# Patient Record
Sex: Female | Born: 1965 | Race: White | Hispanic: No | Marital: Married | Smoking: Former smoker
Health system: Southern US, Community
[De-identification: ages and names within clinical notes are randomized; demographics above are authoritative.]

## PROBLEM LIST (undated history)

## (undated) DIAGNOSIS — R3915 Urgency of urination: Secondary | ICD-10-CM

## (undated) DIAGNOSIS — K589 Irritable bowel syndrome without diarrhea: Secondary | ICD-10-CM

## (undated) DIAGNOSIS — R519 Headache, unspecified: Secondary | ICD-10-CM

## (undated) DIAGNOSIS — I10 Essential (primary) hypertension: Secondary | ICD-10-CM

## (undated) DIAGNOSIS — F32A Depression, unspecified: Secondary | ICD-10-CM

## (undated) DIAGNOSIS — Z87828 Personal history of other (healed) physical injury and trauma: Secondary | ICD-10-CM

## (undated) DIAGNOSIS — N368 Other specified disorders of urethra: Secondary | ICD-10-CM

## (undated) DIAGNOSIS — M1990 Unspecified osteoarthritis, unspecified site: Secondary | ICD-10-CM

## (undated) DIAGNOSIS — R350 Frequency of micturition: Secondary | ICD-10-CM

## (undated) DIAGNOSIS — K219 Gastro-esophageal reflux disease without esophagitis: Secondary | ICD-10-CM

## (undated) DIAGNOSIS — N189 Chronic kidney disease, unspecified: Secondary | ICD-10-CM

## (undated) DIAGNOSIS — Z8489 Family history of other specified conditions: Secondary | ICD-10-CM

## (undated) DIAGNOSIS — Z973 Presence of spectacles and contact lenses: Secondary | ICD-10-CM

## (undated) DIAGNOSIS — R51 Headache: Secondary | ICD-10-CM

## (undated) HISTORY — PX: OTHER SURGICAL HISTORY: SHX169

## (undated) HISTORY — PX: CERVICAL SPINE SURGERY: SHX589

## (undated) HISTORY — PX: OVARIAN CYST REMOVAL: SHX89

## (undated) HISTORY — PX: TRANSOBTURATOR SLING: SHX2571

## (undated) HISTORY — PX: TUBAL LIGATION: SHX77

---

## 1988-12-13 HISTORY — PX: MICROTUBOPLASTY: SHX5401

## 2003-04-15 HISTORY — PX: CHOLECYSTECTOMY: SHX55

## 2005-04-14 HISTORY — PX: VAGINAL HYSTERECTOMY: SUR661

## 2014-03-24 ENCOUNTER — Other Ambulatory Visit: Payer: Self-pay | Admitting: Urology

## 2014-04-26 ENCOUNTER — Encounter (HOSPITAL_BASED_OUTPATIENT_CLINIC_OR_DEPARTMENT_OTHER): Payer: Self-pay | Admitting: *Deleted

## 2014-04-27 ENCOUNTER — Encounter (HOSPITAL_BASED_OUTPATIENT_CLINIC_OR_DEPARTMENT_OTHER): Payer: Self-pay | Admitting: *Deleted

## 2014-04-28 ENCOUNTER — Encounter (HOSPITAL_BASED_OUTPATIENT_CLINIC_OR_DEPARTMENT_OTHER): Payer: Self-pay | Admitting: *Deleted

## 2014-04-28 NOTE — Progress Notes (Signed)
NPO AFTER MN. ARRIVE AT 0715. NEEDS ISTAT AND EKG.  WILL DO HIBICLENS HS BEFORE AND AM DOS. WILL TAKE DEXILANT AM DOS W/ SIPS OF WATER.  DEPENDING ON WHAT TYPE OF BP MED. SHE IS ON , WILL NEED TO TAKE PRE-OP. PT TO BRING COMPLETE LIST OF MEDS, AND MEDS, DOS. SHE DID NOT KNOW DOSAGE'S , DID NOT KNOW NAME OF BP MED, AND STATES SHE DID NOT HAVE MEDICATION BOTTLES AT HOME.  REVIEWED [facility] GUIDELINES.

## 2014-05-01 ENCOUNTER — Ambulatory Visit (HOSPITAL_BASED_OUTPATIENT_CLINIC_OR_DEPARTMENT_OTHER): Payer: Medicare Other | Admitting: Certified Registered"

## 2014-05-01 ENCOUNTER — Ambulatory Visit (HOSPITAL_BASED_OUTPATIENT_CLINIC_OR_DEPARTMENT_OTHER)
Admission: RE | Admit: 2014-05-01 | Discharge: 2014-05-02 | Disposition: A | Discharge status: Home / Self Care | Payer: Medicare Other | Source: Ambulatory Visit | Attending: Urology | Admitting: Urology

## 2014-05-01 ENCOUNTER — Other Ambulatory Visit: Payer: Self-pay

## 2014-05-01 ENCOUNTER — Encounter (HOSPITAL_BASED_OUTPATIENT_CLINIC_OR_DEPARTMENT_OTHER): Payer: Self-pay | Admitting: Certified Registered"

## 2014-05-01 ENCOUNTER — Encounter (HOSPITAL_BASED_OUTPATIENT_CLINIC_OR_DEPARTMENT_OTHER)
Admission: RE | Disposition: A | Discharge status: Home / Self Care | Payer: Self-pay | Source: Ambulatory Visit | Attending: Urology

## 2014-05-01 DIAGNOSIS — Y929 Unspecified place or not applicable: Secondary | ICD-10-CM | POA: Insufficient documentation

## 2014-05-01 DIAGNOSIS — I10 Essential (primary) hypertension: Secondary | ICD-10-CM | POA: Diagnosis not present

## 2014-05-01 DIAGNOSIS — K219 Gastro-esophageal reflux disease without esophagitis: Secondary | ICD-10-CM | POA: Diagnosis not present

## 2014-05-01 DIAGNOSIS — Z79899 Other long term (current) drug therapy: Secondary | ICD-10-CM | POA: Diagnosis not present

## 2014-05-01 DIAGNOSIS — N393 Stress incontinence (female) (male): Secondary | ICD-10-CM | POA: Diagnosis not present

## 2014-05-01 DIAGNOSIS — G43909 Migraine, unspecified, not intractable, without status migrainosus: Secondary | ICD-10-CM | POA: Insufficient documentation

## 2014-05-01 DIAGNOSIS — G8929 Other chronic pain: Secondary | ICD-10-CM | POA: Insufficient documentation

## 2014-05-01 DIAGNOSIS — Z7982 Long term (current) use of aspirin: Secondary | ICD-10-CM | POA: Diagnosis not present

## 2014-05-01 DIAGNOSIS — N368 Other specified disorders of urethra: Secondary | ICD-10-CM | POA: Diagnosis not present

## 2014-05-01 DIAGNOSIS — F329 Major depressive disorder, single episode, unspecified: Secondary | ICD-10-CM | POA: Insufficient documentation

## 2014-05-01 DIAGNOSIS — N058 Unspecified nephritic syndrome with other morphologic changes: Secondary | ICD-10-CM | POA: Insufficient documentation

## 2014-05-01 DIAGNOSIS — Z87891 Personal history of nicotine dependence: Secondary | ICD-10-CM | POA: Diagnosis not present

## 2014-05-01 DIAGNOSIS — K589 Irritable bowel syndrome without diarrhea: Secondary | ICD-10-CM | POA: Diagnosis not present

## 2014-05-01 DIAGNOSIS — T83711A Erosion of implanted vaginal mesh and other prosthetic materials to surrounding organ or tissue, initial encounter: Secondary | ICD-10-CM | POA: Insufficient documentation

## 2014-05-01 DIAGNOSIS — Y69 Unspecified misadventure during surgical and medical care: Secondary | ICD-10-CM | POA: Diagnosis not present

## 2014-05-01 DIAGNOSIS — M1990 Unspecified osteoarthritis, unspecified site: Secondary | ICD-10-CM | POA: Diagnosis not present

## 2014-05-01 DIAGNOSIS — F909 Attention-deficit hyperactivity disorder, unspecified type: Secondary | ICD-10-CM | POA: Insufficient documentation

## 2014-05-01 DIAGNOSIS — R32 Unspecified urinary incontinence: Secondary | ICD-10-CM | POA: Diagnosis present

## 2014-05-01 HISTORY — DX: Essential (primary) hypertension: I10

## 2014-05-01 HISTORY — DX: Urgency of urination: R39.15

## 2014-05-01 HISTORY — DX: Frequency of micturition: R35.0

## 2014-05-01 HISTORY — DX: Headache, unspecified: R51.9

## 2014-05-01 HISTORY — DX: Headache: R51

## 2014-05-01 HISTORY — DX: Personal history of other (healed) physical injury and trauma: Z87.828

## 2014-05-01 HISTORY — DX: Gastro-esophageal reflux disease without esophagitis: K21.9

## 2014-05-01 HISTORY — DX: Presence of spectacles and contact lenses: Z97.3

## 2014-05-01 HISTORY — DX: Irritable bowel syndrome, unspecified: K58.9

## 2014-05-01 HISTORY — DX: Other specified disorders of urethra: N36.8

## 2014-05-01 HISTORY — PX: REVISION URINARY SLING: SHX6213

## 2014-05-01 LAB — POCT I-STAT 4, (NA,K, GLUC, HGB,HCT)
Glucose, Bld: 94 mg/dL (ref 70–99)
HEMATOCRIT: 38 % (ref 36.0–46.0)
Hemoglobin: 12.9 g/dL (ref 12.0–15.0)
Potassium: 3.5 mmol/L (ref 3.5–5.1)
SODIUM: 138 mmol/L (ref 135–145)

## 2014-05-01 SURGERY — REVISION, SLING OPERATION, FOR STRESS INCONTINENCE
Anesthesia: General | Site: Vagina

## 2014-05-01 MED ORDER — FENTANYL CITRATE 0.05 MG/ML IJ SOLN
INTRAMUSCULAR | Status: DC | PRN
  Administered 2014-05-01: 25 ug via INTRAVENOUS
  Administered 2014-05-01: 50 ug via INTRAVENOUS
  Administered 2014-05-01 (×3): 25 ug via INTRAVENOUS
  Administered 2014-05-01: 50 ug via INTRAVENOUS

## 2014-05-01 MED ORDER — CEFAZOLIN SODIUM-DEXTROSE 2-3 GM-% IV SOLR
INTRAVENOUS | Status: AC
  Filled 2014-05-01: qty 50

## 2014-05-01 MED ORDER — CIPROFLOXACIN HCL 500 MG PO TABS
500.0000 mg | ORAL_TABLET | Freq: Two times a day (BID) | ORAL | Status: DC
  Administered 2014-05-01: 500 mg via ORAL
  Filled 2014-05-01: qty 1

## 2014-05-01 MED ORDER — MIDAZOLAM HCL 5 MG/5ML IJ SOLN
INTRAMUSCULAR | Status: DC | PRN
  Administered 2014-05-01: 2 mg via INTRAVENOUS

## 2014-05-01 MED ORDER — DIPHENHYDRAMINE HCL 12.5 MG/5ML PO ELIX
12.5000 mg | ORAL_SOLUTION | Freq: Four times a day (QID) | ORAL | Status: DC | PRN
Start: 2014-05-01 — End: 2014-05-02
  Filled 2014-05-01: qty 10

## 2014-05-01 MED ORDER — SODIUM CHLORIDE 0.45 % IV SOLN
INTRAVENOUS | Status: DC
  Administered 2014-05-01: via INTRAVENOUS
  Filled 2014-05-01: qty 1000

## 2014-05-01 MED ORDER — STERILE WATER FOR IRRIGATION IR SOLN
Status: DC | PRN
  Administered 2014-05-01: 500 mL

## 2014-05-01 MED ORDER — SODIUM CHLORIDE 0.9 % IR SOLN
Status: DC | PRN
  Administered 2014-05-01: 500 mL

## 2014-05-01 MED ORDER — MIDAZOLAM HCL 2 MG/2ML IJ SOLN
INTRAMUSCULAR | Status: AC
  Filled 2014-05-01: qty 2

## 2014-05-01 MED ORDER — HYOSCYAMINE SULFATE 0.125 MG SL SUBL
0.1250 mg | SUBLINGUAL_TABLET | SUBLINGUAL | Status: DC | PRN
  Filled 2014-05-01: qty 1

## 2014-05-01 MED ORDER — HYDROCODONE-ACETAMINOPHEN 5-325 MG PO TABS
1.0000 | ORAL_TABLET | Freq: Four times a day (QID) | ORAL | Status: DC | PRN
  Administered 2014-05-01: 1 via ORAL
  Filled 2014-05-01: qty 1

## 2014-05-01 MED ORDER — DULOXETINE HCL 60 MG PO CPEP
60.0000 mg | ORAL_CAPSULE | Freq: Every day | ORAL | Status: DC
  Filled 2014-05-01: qty 1

## 2014-05-01 MED ORDER — SENNOSIDES-DOCUSATE SODIUM 8.6-50 MG PO TABS
2.0000 | ORAL_TABLET | Freq: Every day | ORAL | Status: DC
Start: 2014-05-01 — End: 2014-05-02
  Filled 2014-05-01: qty 2

## 2014-05-01 MED ORDER — LACTATED RINGERS IV SOLN
INTRAVENOUS | Status: DC
  Administered 2014-05-01 (×2): via INTRAVENOUS
  Filled 2014-05-01: qty 1000

## 2014-05-01 MED ORDER — LACTATED RINGERS IV SOLN
INTRAVENOUS | Status: DC
  Filled 2014-05-01: qty 1000

## 2014-05-01 MED ORDER — CYCLOBENZAPRINE HCL 10 MG PO TABS
10.0000 mg | ORAL_TABLET | Freq: Three times a day (TID) | ORAL | Status: DC | PRN
  Filled 2014-05-01: qty 1

## 2014-05-01 MED ORDER — ONDANSETRON HCL 4 MG/2ML IJ SOLN
INTRAMUSCULAR | Status: DC | PRN
  Administered 2014-05-01: 4 mg via INTRAVENOUS

## 2014-05-01 MED ORDER — CIPROFLOXACIN HCL 500 MG PO TABS
ORAL_TABLET | ORAL | Status: AC
  Filled 2014-05-01: qty 1

## 2014-05-01 MED ORDER — LIDOCAINE HCL (CARDIAC) 20 MG/ML IV SOLN
INTRAVENOUS | Status: DC | PRN
  Administered 2014-05-01: 60 mg via INTRAVENOUS

## 2014-05-01 MED ORDER — HYDROCODONE-ACETAMINOPHEN 5-325 MG PO TABS
ORAL_TABLET | ORAL | Status: AC
  Filled 2014-05-01: qty 1

## 2014-05-01 MED ORDER — ZOLPIDEM TARTRATE 5 MG PO TABS
ORAL_TABLET | ORAL | Status: AC
  Filled 2014-05-01: qty 1

## 2014-05-01 MED ORDER — ACETAMINOPHEN 10 MG/ML IV SOLN
1000.0000 mg | Freq: Once | INTRAVENOUS | Status: AC
  Filled 2014-05-01: qty 100

## 2014-05-01 MED ORDER — ACETAMINOPHEN 10 MG/ML IV SOLN
INTRAVENOUS | Status: DC | PRN
  Administered 2014-05-01: 1000 mg via INTRAVENOUS

## 2014-05-01 MED ORDER — FUROSEMIDE 40 MG PO TABS
40.0000 mg | ORAL_TABLET | Freq: Every day | ORAL | Status: DC
  Filled 2014-05-01: qty 1

## 2014-05-01 MED ORDER — AMPHETAMINE-DEXTROAMPHET ER 20 MG PO CP24
20.0000 mg | ORAL_CAPSULE | Freq: Every day | ORAL | Status: DC
  Filled 2014-05-01: qty 1

## 2014-05-01 MED ORDER — KETOROLAC TROMETHAMINE 30 MG/ML IJ SOLN
30.0000 mg | Freq: Once | INTRAMUSCULAR | Status: AC
  Administered 2014-05-01: 30 mg via INTRAVENOUS
  Filled 2014-05-01: qty 1

## 2014-05-01 MED ORDER — DIPHENHYDRAMINE HCL 50 MG/ML IJ SOLN
12.5000 mg | Freq: Four times a day (QID) | INTRAMUSCULAR | Status: DC | PRN
  Filled 2014-05-01: qty 0.5

## 2014-05-01 MED ORDER — CHLORHEXIDINE GLUCONATE 4 % EX LIQD
Freq: Once | CUTANEOUS | Status: DC
  Filled 2014-05-01: qty 15

## 2014-05-01 MED ORDER — PANTOPRAZOLE SODIUM 40 MG PO TBEC
40.0000 mg | DELAYED_RELEASE_TABLET | Freq: Every day | ORAL | Status: DC
  Filled 2014-05-01: qty 1

## 2014-05-01 MED ORDER — ZOLPIDEM TARTRATE 5 MG PO TABS
5.0000 mg | ORAL_TABLET | Freq: Every evening | ORAL | Status: DC | PRN
  Administered 2014-05-01: 5 mg via ORAL
  Filled 2014-05-01: qty 1

## 2014-05-01 MED ORDER — LOSARTAN POTASSIUM-HCTZ 50-12.5 MG PO TABS: 1.0000 | ORAL_TABLET | Freq: Every day | ORAL | Status: DC

## 2014-05-01 MED ORDER — ESTRADIOL 0.1 MG/GM VA CREA
TOPICAL_CREAM | VAGINAL | Status: DC | PRN
  Administered 2014-05-01: 1 via VAGINAL

## 2014-05-01 MED ORDER — ONDANSETRON HCL 4 MG/2ML IJ SOLN
4.0000 mg | INTRAMUSCULAR | Status: DC | PRN
  Filled 2014-05-01: qty 2

## 2014-05-01 MED ORDER — BUPIVACAINE-EPINEPHRINE 0.5% -1:200000 IJ SOLN
INTRAMUSCULAR | Status: DC | PRN
  Administered 2014-05-01: 10 mL

## 2014-05-01 MED ORDER — PROPOFOL 10 MG/ML IV BOLUS
INTRAVENOUS | Status: DC | PRN
  Administered 2014-05-01: 200 mg via INTRAVENOUS

## 2014-05-01 MED ORDER — FENTANYL CITRATE 0.05 MG/ML IJ SOLN
INTRAMUSCULAR | Status: AC
  Filled 2014-05-01: qty 6

## 2014-05-01 MED ORDER — DICYCLOMINE HCL 10 MG PO CAPS
10.0000 mg | ORAL_CAPSULE | ORAL | Status: DC | PRN
  Filled 2014-05-01: qty 1

## 2014-05-01 MED ORDER — ESTROGENS CONJUGATED 0.625 MG PO TABS
0.6250 mg | ORAL_TABLET | Freq: Every day | ORAL | Status: DC
  Filled 2014-05-01: qty 1

## 2014-05-01 MED ORDER — HYDROCODONE-ACETAMINOPHEN 5-325 MG PO TABS
1.0000 | ORAL_TABLET | Freq: Four times a day (QID) | ORAL | Status: DC | PRN
  Filled 2014-05-01: qty 1

## 2014-05-01 MED ORDER — DEXAMETHASONE SODIUM PHOSPHATE 4 MG/ML IJ SOLN
INTRAMUSCULAR | Status: DC | PRN
  Administered 2014-05-01: 8 mg via INTRAVENOUS

## 2014-05-01 MED ORDER — CEFAZOLIN SODIUM-DEXTROSE 2-3 GM-% IV SOLR
2.0000 g | INTRAVENOUS | Status: AC
  Administered 2014-05-01: 2 g via INTRAVENOUS
  Filled 2014-05-01: qty 50

## 2014-05-01 SURGICAL SUPPLY — 42 items
ALLOGRAFT TIS AXIS TUTPLST 4X7 (Mesh General) ×1 IMPLANT
BAG URINE DRAINAGE (UROLOGICAL SUPPLIES) ×2 IMPLANT
BLADE CLIPPER SURG (BLADE) ×2 IMPLANT
BLADE SURG 10 STRL SS (BLADE) ×2 IMPLANT
BLADE SURG 15 STRL LF DISP TIS (BLADE) ×1 IMPLANT
BLADE SURG 15 STRL SS (BLADE) ×1
CANISTER SUCTION 2500CC (MISCELLANEOUS) ×4 IMPLANT
CATH FOLEY 2WAY SLVR  5CC 16FR (CATHETERS) ×1
CATH FOLEY 2WAY SLVR 5CC 16FR (CATHETERS) ×1 IMPLANT
COVER MAYO STAND STRL (DRAPES) ×2 IMPLANT
COVER TABLE BACK 60X90 (DRAPES) ×2 IMPLANT
DEVICE CAPIO SLIM BOX (INSTRUMENTS) IMPLANT
DRAPE CAMERA CLOSED 9X96 (DRAPES) ×2 IMPLANT
DRAPE UNDERBUTTOCKS STRL (DRAPE) ×2 IMPLANT
FLOSEAL 10ML (HEMOSTASIS) ×2 IMPLANT
GLOVE BIO SURGEON STRL SZ 6.5 (GLOVE) ×2 IMPLANT
GLOVE BIO SURGEON STRL SZ7.5 (GLOVE) ×2 IMPLANT
GLOVE BIOGEL PI IND STRL 6.5 (GLOVE) ×2 IMPLANT
GLOVE BIOGEL PI INDICATOR 6.5 (GLOVE) ×2
GOWN STRL REUS W/ TWL XL LVL3 (GOWN DISPOSABLE) ×1 IMPLANT
GOWN STRL REUS W/TWL XL LVL3 (GOWN DISPOSABLE) ×3 IMPLANT
NEEDLE HYPO 22GX1.5 SAFETY (NEEDLE) ×4 IMPLANT
PACKING VAGINAL (PACKING) ×2 IMPLANT
PENCIL BUTTON HOLSTER BLD 10FT (ELECTRODE) ×2 IMPLANT
PLUG CATH AND CAP STER (CATHETERS) ×2 IMPLANT
RETRACTOR LONRSTAR 16.6X16.6CM (MISCELLANEOUS) ×1 IMPLANT
RETRACTOR STAY HOOK 5MM (MISCELLANEOUS) ×2 IMPLANT
RETRACTOR STER APS 16.6X16.6CM (MISCELLANEOUS) ×2
SET IRRIG Y TYPE TUR BLADDER L (SET/KITS/TRAYS/PACK) ×2 IMPLANT
SHEET LAVH (DRAPES) ×2 IMPLANT
SLING SOLYX SYSTEM SIS BX (SLING) IMPLANT
SPONGE LAP 4X18 X RAY DECT (DISPOSABLE) ×2 IMPLANT
SUCTION FRAZIER TIP 10 FR DISP (SUCTIONS) ×2 IMPLANT
SUT VIC AB 2-0 UR6 27 (SUTURE) ×8 IMPLANT
SYR BULB IRRIGATION 50ML (SYRINGE) ×2 IMPLANT
SYR CONTROL 10ML LL (SYRINGE) ×2 IMPLANT
SYRINGE 10CC LL (SYRINGE) ×2 IMPLANT
TISSUE AXIS TUTOPLAST 4X7 (Mesh General) ×2 IMPLANT
TRAY DSU PREP LF (CUSTOM PROCEDURE TRAY) ×2 IMPLANT
TUBE CONNECTING 12X1/4 (SUCTIONS) ×4 IMPLANT
WATER STERILE IRR 500ML POUR (IV SOLUTION) ×4 IMPLANT
YANKAUER SUCT BULB TIP NO VENT (SUCTIONS) ×2 IMPLANT

## 2014-05-01 NOTE — H&P (Signed)
Reason For Visit Cystoscopy & review urodynamic results   Active Problems Problems  1. Urinary incontinence without sensory awareness (N39.42)   Assessed By: Jethro Bolusannenbaum, Eulalah Rupert (Urology); Last Assessed: 14 Mar 2014 2. Vaginal Sling Operation For Stress Incontinence  History of Present Illness    49 year old female returns today for a cystoscopy & to review urodynamic results. She is s/p trans-obturator tape, referred for evaluation of voiding difficulty, with urinary straining, urgency, and postvoid dribbling. She is para 4-3-0. 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 The patient began to notice cough laugh sneeze incontinence in 2002. She also had abnormal uterine bleeding, and underwent coincidental LAVH, with trans-obturator sling in 2000. She did well until 2013, when she began to note urinary leakage, without sensation. The amount of leakage varies from a small amount, to enough to soak her close. There is no associated pelvic pain. She uses no tobacco.     She exercises consistently, and has been treated recently for recurrent vaginal odor, and vaginal infection. She has urinary urgency, frequency, and nocturia x 3. Of importance is her past history of ADHD, treated with Adderall, as well as chronic pain, treated with Cymbalta. She also takes Lasix 40 mg per day. She drinks 3 Cokes per day.(Note bladder effect of medications). Her past history is also significant for degenerative cervical spinal stenosis.    Video urodynamics on 02/14/14, shows a maximum cystometric capacity of 224 cc. She has a normal desire to void at 44 cc, and a strong desire at 186 cc. The first unstable bladder contraction occurs at 120 cc, with a maximum unstable bladder contraction pressure of 14 cm of water pressure, with no urinary leakage. She is able to inhibit these unstable contractions.    Valsalva leak point pressure: The patient did  not leak for Valsalva leak point pressure with a maximum abdominal pressure generation of 135cm of water pressure.    Pressure flow study: The patient has a voluntary bladder contraction, with voided volume of 198 cc, and maximum flow rate of 10 cc/s. The detrusor pressure at maximum flow was 43 cm of water pressure, with maximum detrusor pressure of 50-70 cm H20 pressure. Postvoid residual was 25 cc. The patient appears to have a high pressure with obstructed flow pattern.    ECGs accomplished, and shows no reflux.    The patient appears to have a maximum capacity of 224 cc. There is no stress incontinence noted. She does have bladder pressures as high as 135 cm of water. She has mild instability but is able to inhibit these unstable contractions. She has relatively low voiding flow rate, and appears to be obstructed. She does empty efficiently, however. It is noted that the patient mentioned that she would like to have her sling removed. She will be counseled accordingly.   Past Medical History Problems  1. History of Degenerative cervical spinal stenosis (M48.02) 2. History of attention deficit hyperactivity disorder (ADHD) (Z86.59) 3. History of degenerative disc disease (Z87.39) 4. History of depression (Z86.59) 5. History of esophageal reflux (Z87.19) 6. History of gastric ulcer (Z87.19) 7. History of hypertension (Z86.79) 8. History of irritable bowel syndrome (Z87.19) 9. History of migraine headaches (Z86.69) 10. History of osteoarthritis (Z87.39)  Surgical History Problems  1. History of Hysterectomy 2. History of Mid-Urethral Sling Operation 3. Vaginal Sling Operation For Stress Incontinence  Current Meds 1. Aspirin 81 MG Oral Tablet;  Therapy: (Recorded:07Oct2015) to Recorded 2. Cymbalta 60  MG Oral Capsule Delayed Release Particles;  Therapy: (Recorded:07Oct2015) to Recorded 3. Dexilant 60 MG Oral Capsule Delayed Release;  Therapy: (Recorded:07Oct2015) to  Recorded 4. Diltiazem HCl - 120 MG Oral Tablet;  Therapy: (Recorded:07Oct2015) to Recorded 5. Flexeril 10 MG TABS;  Therapy: (Recorded:07Oct2015) to Recorded 6. Hydrocodone-Acetaminophen 5-500 MG Oral Tablet;  Therapy: (Recorded:07Oct2015) to Recorded 7. Lasix 20 MG Oral Tablet;  Therapy: (Recorded:07Oct2015) to Recorded 8. Premarin 0.625 MG Oral Tablet;  Therapy: (Recorded:07Oct2015) to Recorded 9. Zantac 150 MG CAPS;  Therapy: (Recorded:07Oct2015) to Recorded  Allergies Medication  1. No Known Drug Allergies  Family History Problems  1. No pertinent family history : Mother, Father  Social History Problems  1. Alcohol use (F10.99)   occasionally 2. Caffeine use (F15.90)   2-3 3. Former smoker [phone])   1/2 ppd for 5 years, quite 1995 4. Three children   2 sons, 1 dtr  Review of Systems Genitourinary, constitutional, skin, eye, otolaryngeal, hematologic/lymphatic, cardiovascular, pulmonary, endocrine, musculoskeletal, gastrointestinal, neurological and psychiatric system(s) were reviewed and pertinent findings if present are noted and are otherwise negative.  Genitourinary: urinary frequency, urinary urgency, dysuria, nocturia, incontinence, urinary hesitancy, urinary stream starts and stops and incomplete emptying of bladder.  Gastrointestinal: heartburn and constipation.  Constitutional: feeling tired (fatigue).  Eyes: blurred vision.  ENT: sinus problems.  Hematologic/Lymphatic: a tendency to easily bruise.  Endocrine: polydipsia.  Musculoskeletal: back pain and joint pain.  Neurological: dizziness and headache.  Psychiatric: depression and anxiety.    Vitals Vital Signs [Data Includes: Last 1 Day]  Recorded: 01Dec2015 03:11PM  Blood Pressure: 126 / 85 Temperature: 98.5 F Heart Rate: 112  Physical Exam Constitutional: Well nourished and well developed . No acute distress.  ENT:. The ears and nose are normal in appearance.  Neck: The appearance of the  neck is normal and no neck mass is present.  Pulmonary: No respiratory distress and normal respiratory rhythm and effort.  Cardiovascular:. No peripheral edema.  Abdomen: The abdomen is mildly obese. The abdomen is soft and nontender. No masses are palpated. No CVA tenderness. No hernias are palpable. No hepatosplenomegaly noted.  Genitourinary:  Chaperone Present: laura.  Examination of the external genitalia shows normal female external genitalia, no vulvar atrophy and no lesions. The urethra is normal in appearance and not tender. There is no urethral mass. Vaginal exam demonstrates no abnormalities and no mass. No cystocele is identified. No enterocele is identified. No rectocele is identified. There is no evidence of a vesicovaginal fistula. The adnexa are palpably normal. The bladder is normal on palpation, non tender and not distended. The anus is normal on inspection. The perineum is normal on inspection.  Lymphatics: The femoral and inguinal nodes are not enlarged or tender.  Skin: Normal skin turgor, no visible rash and no visible skin lesions.  Neuro/Psych:. Mood and affect are appropriate.    Results/Data Urine [Data Includes: Last 1 Day]   01Dec2015  COLOR YELLOW   APPEARANCE CLEAR   SPECIFIC GRAVITY 1.025   pH 6.0   GLUCOSE NEG mg/dL  BILIRUBIN SMALL   KETONE NEG mg/dL  BLOOD TRACE   PROTEIN NEG mg/dL  UROBILINOGEN 1 mg/dL  NITRITE NEG   LEUKOCYTE ESTERASE NEG   SQUAMOUS EPITHELIAL/HPF MANY   WBC NONE SEEN WBC/hpf  RBC 0-2 RBC/hpf  BACTERIA MODERATE   CRYSTALS Calcium Oxalate crystals noted   CASTS NONE SEEN   Other MUCUS    Procedure  Procedure: Cystoscopy  Chaperone Present: laura.  Indication: Lower Urinary Tract Symptoms.  Informed Consent: Risks, benefits, and potential adverse events were discussed and informed consent was obtained from the patient.  Prep: The patient was prepped with betadine.  Anesthesia:. Local anesthesia was administered  intraurethrally with 2% lidocaine jelly.  Antibiotic prophylaxis: Ciprofloxacin.  Procedure Note:  Urethral meatus:. Was identified. Obstructing tissue overlying the meatus.  Anterior urethra: No abnormalities.  Prostatic urethra: No abnormalities.  Bladder: Visulization was clear. The ureteral orifices were in the normal anatomic position bilaterally. A systematic survey of the bladder demonstrated no bladder tumors or stones. The patient tolerated the procedure well.  Complications: None.    Assessment Assessed  1. Vaginal Sling Operation For Stress Incontinence 2. Urinary incontinence without sensory awareness (N39.42)  49 yo female, originally post TOT sling in [place], 5-6 yrs ago, co-incidental with vaginal hysterectomy. She is complaining of 4 yrs of post void urinary incontinence, and feeling of incomplete emptying. Her urodynamics show high pressure, and low flow voiding pattern. cystoscopy shows a "flap" of tissue overlying her urethra, causing "spray" of voided urine. I have advised her to have this tissue removed, and to have the sling incised with a portion removed. The sling may have migrated to the bladder neck in the posterior portion of the urethra, where it is more likely to cause obstruction.    With sling incision, she may experience stress urinary incontinence once again, and we have discussed the possibility or 2ndary sling, or possibly injection of anti-incontinence material.   Plan Health Maintenance  1. UA With REFLEX; [Do Not Release]; Status:Resulted - Requires  Verification;   Done: 01Dec2015 02:52PM  Schedule surgery of the obstructed urethra.   Signatures Electronically signed by : Jethro Bolus, M.D.; Mar 14 2014  4:16PM EST

## 2014-05-01 NOTE — Interval H&P Note (Signed)
History and Physical Interval Note:  05/01/2014 8:35 AM  Angela Arroyo  has presented today for surgery, with the diagnosis of OBSTRUCTED URETHRA  The various methods of treatment have been discussed with the patient and family. After consideration of risks, benefits and other options for treatment, the patient has consented to  Procedure(s) with comments: REVISION URINARY SLING (N/A) - VAGINAL EXPLORATION AND INCISION OF TOT SLING REMOVAL OF PERI-URETHRAL TISSUE   as a surgical intervention .  The patient's history has been reviewed, patient examined, no change in status, stable for surgery.  I have reviewed the patient's chart and labs.  Questions were answered to the patient's satisfaction.     Jethro BolusANNENBAUM, Kaesyn Johnston I

## 2014-05-01 NOTE — Anesthesia Preprocedure Evaluation (Signed)
Anesthesia Evaluation  Patient identified by MRN, date of birth, ID band Patient awake    Reviewed: Allergy & Precautions, H&P , NPO status , Patient's Chart, lab work & pertinent test results  Airway Mallampati: II  TM Distance: >3 FB Neck ROM: full    Dental no notable dental hx. (+) Teeth Intact, Dental Advisory Given   Pulmonary neg pulmonary ROS, former smoker,  breath sounds clear to auscultation  Pulmonary exam normal       Cardiovascular Exercise Tolerance: Good hypertension, Pt. on medications negative cardio ROS  Rhythm:regular Rate:Normal     Neuro/Psych negative neurological ROS  negative psych ROS   GI/Hepatic negative GI ROS, Neg liver ROS, GERD-  Medicated and Controlled,  Endo/Other  negative endocrine ROS  Renal/GU negative Renal ROS  negative genitourinary   Musculoskeletal   Abdominal   Peds  Hematology negative hematology ROS (+)   Anesthesia Other Findings   Reproductive/Obstetrics negative OB ROS                             Anesthesia Physical Anesthesia Plan  ASA: II  Anesthesia Plan: General   Post-op Pain Management:    Induction: Intravenous  Airway Management Planned: LMA  Additional Equipment:   Intra-op Plan:   Post-operative Plan:   Informed Consent: I have reviewed the patients History and Physical, chart, labs and discussed the procedure including the risks, benefits and alternatives for the proposed anesthesia with the patient or authorized representative who has indicated his/her understanding and acceptance.   Dental Advisory Given  Plan Discussed with: CRNA and Surgeon  Anesthesia Plan Comments:         Anesthesia Quick Evaluation

## 2014-05-01 NOTE — Anesthesia Procedure Notes (Signed)
Procedure Name: LMA Insertion Date/Time: 05/01/2014 9:15 AM Performed by: Renella CunasHAZEL, Alex Leahy D Pre-anesthesia Checklist: Patient identified, Emergency Drugs available, Suction available and Patient being monitored Patient Re-evaluated:Patient Re-evaluated prior to inductionOxygen Delivery Method: Circle System Utilized Preoxygenation: Pre-oxygenation with 100% oxygen Intubation Type: IV induction Ventilation: Mask ventilation without difficulty LMA: LMA inserted LMA Size: 4.0 Number of attempts: 1 Airway Equipment and Method: Bite block Placement Confirmation: positive ETCO2 Tube secured with: Tape Dental Injury: Teeth and Oropharynx as per pre-operative assessment

## 2014-05-01 NOTE — Anesthesia Postprocedure Evaluation (Signed)
  Anesthesia Post-op Note  Patient: Angela Arroyo  Procedure(s) Performed: Procedure(s) (LRB): URETHERAL EXPLORATION, URETHEROLYSIS, EXCISION OF TRANSOBTORATOR MESH SLING WITH EXCISION OF VAGINAL SINUS, IMPLANTATION OF AXIS DERMIS 4X4 CM, AUGMENTATION RECONSTRUCTION OF INCISION SITE, PLACEMENT OF FLOSEAL (N/A)  Patient Location: PACU  Anesthesia Type: General  Level of Consciousness: awake and alert   Airway and Oxygen Therapy: Patient Spontanous Breathing  Post-op Pain: mild  Post-op Assessment: Post-op Vital signs reviewed, Patient's Cardiovascular Status Stable, Respiratory Function Stable, Patent Airway and No signs of Nausea or vomiting  Last Vitals:  Filed Vitals:   05/01/14 1145  BP: 97/63  Pulse: 89  Temp:   Resp: 13    Post-op Vital Signs: stable   Complications: No apparent anesthesia complications

## 2014-05-01 NOTE — Op Note (Signed)
Pre-operative diagnosis :  Vaginal mesh extrusion,with vaginal discomfort  Postoperative diagnosis: Same  Operation: Excision of vaginal mesh extrusion, with excision of vaginal sinus, and implantation of AXIS Dermis  ( 4cm x 4cm) with augmentation bladder neck repair.   Surgeon:  Kathie RhodesS. Patsi Searsannenbaum, MD  First assistant:  none  Anesthesia:  General LMA  Preparation:  After appropriate preanesthesia, the patient was brought the operating, placed on the operating table in the dorsal supine position where general LMA anesthesia was introduced. She was then replaced in the dorsal lithotomy position with the pubis was prepped with Betadine solution and draped in usual fashion. Arm was double checked. The history was double checked. The entire history was reviewed with the patient and her husband prior to the this surgical procedure.  Review history:   49 year old female returns today for a cystoscopy & to review urodynamic results. She is s/p trans-obturator tape, referred for evaluation of voiding difficulty, with urinary straining, urgency, and postvoid dribbling. She is para 4-3-0. 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 The patient began to notice cough laugh sneeze incontinence in 2002. She also had abnormal uterine bleeding, and underwent coincidental LAVH, with trans-obturator sling in 2000. She did well until 2013, when she began to note urinary leakage, without sensation. The amount of leakage varies from a small amount, to enough to soak her close. There is no associated pelvic pain. She uses no tobacco.     She exercises consistently, and has been treated recently for recurrent vaginal odor, and vaginal infection. She has urinary urgency, frequency, and nocturia x 3. Of importance is her past history of ADHD, treated with Adderall, as well as chronic pain, treated with Cymbalta. She also takes Lasix 40 mg per day. She drinks 3 Cokes  per day.(Note bladder effect of medications). Her past history is also significant for degenerative cervical spinal stenosis.    Video urodynamics on 02/14/14, shows a maximum cystometric capacity of 224 cc. She has a normal desire to void at 44 cc, and a strong desire at 186 cc. The first unstable bladder contraction occurs at 120 cc, with a maximum unstable bladder contraction pressure of 14 cm of water pressure, with no urinary leakage. She is able to inhibit these unstable contractions.    Valsalva leak point pressure: The patient did not leak for Valsalva leak point pressure with a maximum abdominal pressure generation of 135cm of water pressure.    Pressure flow study: The patient has a voluntary bladder contraction, with voided volume of 198 cc, and maximum flow rate of 10 cc/s. The detrusor pressure at maximum flow was 43 cm of water pressure, with maximum detrusor pressure of 50-70 cm H20 pressure. Postvoid residual was 25 cc. The patient appears to have a high pressure with obstructed flow pattern.    ECGs accomplished, and shows no reflux.    The patient appears to have a maximum capacity of 224 cc. There is no stress incontinence noted. She does have bladder pressures as high as 135 cm of water. She has mild instability but is able to inhibit these unstable contractions. She has relatively low voiding flow rate, and appears to be obstructed. She does empty efficiently, however. It is noted that the patient mentioned that she would like to have her sling removed. She will be counseled accordingly.    Statement of  Likelihood of Success: Excellent. TIME-OUT observed.:  Procedure:  Vaginal inspection of appeared revealed normal vaginal epithelium. Close  inspection revealed a small vaginal sinus at the level of the bladder neck. 10 mL of Marcaine 25 with epinephrine 1-250,000 was then injected in the area of the midline of the urethra, and the period space. A 2 cm mid urethral  incision was then accomplished, eventually increasing the incision length to 3 cm, to include the bladder neck.  Dissection was accomplished on the right side the urethra first, yielding the band of mesh of TUT material, which was dissected as high up as possible, and cut, and which was dissected off of the urethra. The band appeared to the level of bladder neck, it appeared to traced to a sinus, at the level of the bladder neck, which was excised. The band was removed in pieces. Bleeding was noted, and was controlled with pressure. The bladder neck was dissected as carefully as possible, and the left side of the urethra was dissected in order to remove the TUT tissue and mesh as carefully as possible, and as high as possible. More bleeding was noted from the left side as well. Irrigation was a Collis with anabolic irrigation. I did not want to electrocoagulate the urethra. I therefore elected to reconstruct the periurethral bladder neck tissue with AXIS dermal graft tissue, and a 4 x 7 Sameer portion was selected, and soaked for 5 minutes in antibiotic solution. This was then cut to measure, and sutured in place around the urethra. The periurethral tissue was then sutured to the graft material, and 10 mL of FloSeal was then used to control bleeding. After 3 minutes of pressure with FloSeal, no bleeding was noted. The wound edges were freshened, and hypertrophic vaginal epithelial tissue was excised. The wound was then closed with running 2-0 Vicryl suture, and vaginal packing was applied with Estrace cream. Cystourethroscopy revealed normal-appearing bladder base, and normal-appearing urethra.  The patient was then awakened, after she was given IV Tylenol and IV Toradol. She was also given IV antibiotics during the procedure. She was awakened and taken to recovery room in good condition.

## 2014-05-01 NOTE — Transfer of Care (Signed)
Immediate Anesthesia Transfer of Care Note  Patient: Angela Arroyo  Procedure(s) Performed: Procedure(s) (LRB): URETHERAL EXPLORATION, URETHEROLYSIS, EXCISION OF TRANSOBTORATOR MESH SLING WITH EXCISION OF VAGINAL SINUS, IMPLANTATION OF AXIS DERMIS 4X4 CM, AUGMENTATION RECONSTRUCTION OF INCISION SITE, PLACEMENT OF FLOSEAL (N/A)  Patient Location: PACU  Anesthesia Type: General  Level of Consciousness: awake, oriented, sedated and patient cooperative  Airway & Oxygen Therapy: Patient Spontanous Breathing and Patient connected to face mask oxygen  Post-op Assessment: Report given to PACU RN and Post -op Vital signs reviewed and stable  Post vital signs: Reviewed and stable  Complications: No apparent anesthesia complications

## 2014-05-02 ENCOUNTER — Encounter (HOSPITAL_BASED_OUTPATIENT_CLINIC_OR_DEPARTMENT_OTHER): Payer: Self-pay | Admitting: Urology

## 2014-05-02 DIAGNOSIS — T83711A Erosion of implanted vaginal mesh and other prosthetic materials to surrounding organ or tissue, initial encounter: Secondary | ICD-10-CM | POA: Diagnosis not present

## 2014-05-02 LAB — CREATININE, SERUM
CREATININE: 0.76 mg/dL (ref 0.50–1.10)
GFR calc Af Amer: >90 mL/min (ref >90)
GFR calc non Af Amer: >90 mL/min (ref >90)

## 2014-05-02 MED ORDER — HYDROCODONE-IBUPROFEN 5-200 MG PO TABS: 1.0000 | ORAL_TABLET | Freq: Four times a day (QID) | ORAL | Status: DC | PRN

## 2014-05-02 MED ORDER — PHENAZOPYRIDINE HCL 200 MG PO TABS: 200.0000 mg | ORAL_TABLET | Freq: Three times a day (TID) | ORAL | Status: DC | PRN

## 2014-05-02 MED ORDER — CIPROFLOXACIN HCL 500 MG PO TABS: 500.0000 mg | ORAL_TABLET | Freq: Two times a day (BID) | ORAL | Status: DC

## 2014-05-02 NOTE — Progress Notes (Signed)
Pt's foley catheter and vaginal packing removed per order.  Pt tolerated procedure well.  Pt stated "feeling sore but not requesting pain med".  Pt's pain scale during night 0.  Pt up w/ nsg in hall approx midnight walking 2 laps.

## 2014-05-02 NOTE — Discharge Instructions (Addendum)
Post Anesthesia Home Care Instructions  Activity: Get plenty of rest for the remainder of the day. A responsible adult should stay with you for 24 hours following the procedure.  For the next 24 hours, DO NOT: -Drive a car -Advertising copywriterperate machinery -Drink alcoholic beverages -Take any medication unless instructed by your physician -Make any legal decisions or sign important papers.  Meals: Start with liquid foods such as gelatin or soup. Progress to regular foods as tolerated. Avoid greasy, spicy, heavy foods. If nausea and/or vomiting occur, drink only clear liquids until the nausea and/or vomiting subsides. Call your physician if vomiting continues.  Special Instructions/Symptoms: Your throat may feel dry or sore from the anesthesia or the breathing tube placed in your throat during surgery. If this causes discomfort, gargle with warm salt water. The discomfort should disappear within 24 hours.  Urethral Vaginal Sling A urethral vaginal sling procedure is surgery to correct urinary incontinence. Urinary incontinence is uncontrolled loss of urine. It is common in women who have had children and in older women. In this surgery, a strong piece of material is placed under the tube that drains the bladder (urethra). This sling is made of tension-free vaginal tape or nylon mesh. It fits under the urethra like a hammock. The sling is put in position to straighten, support, and hold the urethra in its normal position.  [facility] PROVIDER KNOW ABOUT:   Any allergies you have.  All medicines you are taking, including vitamins, herbs, eye drops, creams, and over-the-counter medicines.  Previous problems you or members of your family have had with the use of anesthetics.  Any blood disorders you have.  Previous surgeries you have had.  Medical conditions you have. RISKS AND COMPLICATIONS  Generally, this is a safe procedure. However, as with any procedure, complications can occur.  Possible complications include:  Infection.  Excessive bleeding.  Damage to other organs.  Problems urinating properly for several days or weeks.  Problems from the use of anesthetics.  Return of the urinary incontinence. BEFORE THE PROCEDURE   Ask your health care provider about changing or stopping your regular medicines. You may need to stop taking certain medicines 1 week before the surgery.  Do not eat or drink anything for 6-8 hours before the surgery.  If you smoke, do not smoke for at least 2 weeks before the surgery.  Make plans to have someone drive you home after your hospital stay. Also arrange for someone to help you with activities during recovery. PROCEDURE   You will have general or spinal anesthesia. With general anesthesia, you are asleep and will feel no pain. With spinal anesthesia, you are numb from the waist down, but you will still be awake.  A catheter is placed in your bladder to drain urine during the procedure.  An incision is made in your vagina and low on your belly in the hairline.  The sling material is passed around your bladder neck and sutured to the muscles to hold the urethra in its normal position.  The incisions are closed. AFTER THE PROCEDURE   You will be taken to a recovery area where your progress will be monitored closely. Your breathing, blood pressure, and pulse (vital signs) will be checked often. When you are stable, you will be moved to a regular hospital room.  You will have a catheter in place to drain your bladder. This will stay in place until your bladder is working properly on its own.  You may  have a gauze packing in the vagina to prevent bleeding. This will be removed in 1-2 days.  You will likely need to stay in the hospital for 2-3 days. Document Released: 01/08/2008 Document Revised: 01/19/2013 Document Reviewed: 09/17/2012 [facility] Patient Information 2015 [place], [place]. This information is not intended to  replace advice given to you by your health care provider. Make sure you discuss any questions you have with your health care provider.

## 2014-05-02 NOTE — Progress Notes (Signed)
Urology Progress Note  1 Day Post-Op   Subjective: packing and foley out. Awaiting morning void. No sensation yet.  Eating well.  No pain med needed. Explained surgery and reconstruction, and bleeding.      No acute urologic events overnight. Ambulation:   positive Flatus:    positive Bowel movement  negative  Pain: complete resolution  Objective:  Blood pressure 98/64, pulse 80, temperature 97.7 F (36.5 C), temperature source Oral, resp. rate 16, height 5\' 7"  (1.702 m), weight 83.008 kg (183 lb), SpO2 97 %.  Physical Exam:  General:  No acute distress, awake Extremities: extremities normal, atraumatic, no cyanosis or edema Genitourinary:  Flat abdomen. Foley: out. No bleeding.     I/O last 3 completed shifts: In: 1612.5 [P.O.:60; I.V.:1552.5] Out: 2400 [Urine:2400]  Recent Labs     05/01/14  0854  HGB  12.9    Recent Labs     05/01/14  0854  05/02/14  0617  NA  138   --   K  3.5   --   CREATININE   --   0.76  GFRNONAA   --   >90  GFRAA   --   >90     No results for input(s): INR, APTT in the last 72 hours.  Invalid input(s): PT   Invalid input(s): ABG  Assessment/Plan:  Follow up per appointment Void prior to discharge.  D/c on Cipro and Vicodin ( intolerant of Percocet.)

## 2014-05-02 NOTE — Discharge Summary (Signed)
  Physician Discharge Summary  Patient ID: Angela Arroyo MRN: [id number] DOB/AGE: 07/18/1965 49 y.o.  Admit date: 05/01/2014 Discharge date: 05/02/2014  Admission Diagnoses: OBSTRUCTED URETHRA  Discharge Diagnoses:  Active Problems:   Incontinence in female   Discharged Condition: Improved, stable Hospital Course: Surgery: Urethrolysis, Excision of Trans-OB Tape for Stress Urinary incontinence, Peri-urethral reconstruction.   Significant Diagnostic Studies: No results found.  Discharge Exam: Blood pressure 98/64, pulse 80, temperature 97.7 F (36.5 C), temperature source Oral, resp. rate 16, height 5\' 7"  (1.702 m), weight 83.008 kg (183 lb), SpO2 97 %.   Disposition: Final discharge disposition not confirmed  Discharge Instructions    Discharge patient    Complete by:  As directed      Discontinue IV    Complete by:  As directed             Medication List    TAKE these medications        ADDERALL PO  Take by mouth daily.     ciprofloxacin 500 MG tablet  Commonly known as:  CIPRO  Take 1 tablet (500 mg total) by mouth 2 (two) times daily.     cyclobenzaprine 10 MG tablet  Commonly known as:  FLEXERIL  Take 10 mg by mouth 3 (three) times daily as needed for muscle spasms.     DEXILANT PO  Take 60 mg by mouth every morning.     dicyclomine 10 MG capsule  Commonly known as:  BENTYL  Take 10 mg by mouth as needed for spasms.     DULoxetine 60 MG capsule  Commonly known as:  CYMBALTA  Take 60 mg by mouth daily.     HYDROcodone-acetaminophen 5-325 MG per tablet  Commonly known as:  NORCO/VICODIN  Take 1 tablet by mouth every 6 (six) hours as needed for moderate pain.     hydrocodone-ibuprofen 5-200 MG per tablet  Commonly known as:  VICOPROFEN  Take 1 tablet by mouth every 6 (six) hours as needed for pain. May increase to 2 tabs q 6 h.     LASIX PO  Take 40 mg by mouth daily.     losartan-hydrochlorothiazide 50-12.5 MG per tablet  Commonly known as:   HYZAAR  Take 1 tablet by mouth daily.     phenazopyridine 200 MG tablet  Commonly known as:  PYRIDIUM  Take 1 tablet (200 mg total) by mouth 3 (three) times daily as needed for pain.     PREMARIN PO  Take 0.625 mg by mouth daily.           Follow-up Information    Follow up with Kathi LudwigANNENBAUM, Clay Solum I, MD.   Specialty:  Urology   Why:  per appointment   Contact information:   [address] [place] [id number] [phone]      Cipro, Vicodin SignedPatsi Arroyo: Angela Arroyo 05/02/2014, 8:27 AM

## 2021-05-14 ENCOUNTER — Other Ambulatory Visit: Payer: Self-pay | Admitting: Surgery

## 2021-05-14 ENCOUNTER — Other Ambulatory Visit (HOSPITAL_COMMUNITY): Payer: Self-pay | Admitting: Surgery

## 2021-05-16 ENCOUNTER — Other Ambulatory Visit: Payer: Self-pay

## 2021-05-16 ENCOUNTER — Encounter: Payer: Self-pay | Admitting: Skilled Nursing Facility1

## 2021-05-16 ENCOUNTER — Encounter: Payer: Medicare Other | Attending: Surgery | Admitting: Skilled Nursing Facility1

## 2021-05-16 DIAGNOSIS — K219 Gastro-esophageal reflux disease without esophagitis: Secondary | ICD-10-CM | POA: Insufficient documentation

## 2021-05-16 DIAGNOSIS — N183 Chronic kidney disease, stage 3 unspecified: Secondary | ICD-10-CM | POA: Diagnosis not present

## 2021-05-16 DIAGNOSIS — Z713 Dietary counseling and surveillance: Secondary | ICD-10-CM | POA: Insufficient documentation

## 2021-05-16 DIAGNOSIS — I129 Hypertensive chronic kidney disease with stage 1 through stage 4 chronic kidney disease, or unspecified chronic kidney disease: Secondary | ICD-10-CM | POA: Diagnosis not present

## 2021-05-16 DIAGNOSIS — Z6837 Body mass index (BMI) 37.0-37.9, adult: Secondary | ICD-10-CM | POA: Insufficient documentation

## 2021-05-16 DIAGNOSIS — E669 Obesity, unspecified: Secondary | ICD-10-CM

## 2021-05-16 NOTE — Progress Notes (Signed)
Nutrition Assessment for Bariatric Surgery Medical Nutrition Therapy Appt Start Time: 2:13    End Time: 3:02  Patient was seen on 05/16/2021 for Pre-Operative Nutrition Assessment. Letter of approval faxed to [facility] bariatric surgery program coordinator on 05/16/2021.   Referral stated Supervised Weight Loss (SWL) visits needed: 0  Pt completed visits.   Pt has cleared nutrition requirements.    Planned surgery: RYGB Pt expectation of surgery: to lose weight Pt expectation of dietitian: none identified     NUTRITION ASSESSMENT   Anthropometrics  Start weight at [facility]: 223.8 lbs (date: 05/16/2021)  Height: 65 in BMI: 37.24 kg/m2     Clinical  Medical hx: IBS, diverticulitis, GERD, HTN, CKD stage 3 Medications: see list  Labs: potassium 3.0, creatinine 1.07 Notable signs/symptoms: acid reflux, constipation, headaches, backaches Any previous deficiencies? No  Micronutrient Nutrition Focused Physical Exam: Hair: No issues observed Eyes: No issues observed Mouth: No issues observed Neck: No issues observed Nails: No issues observed Skin: No issues observed  Lifestyle & Dietary Hx  Pt states she got a dog to help her stay active.   Pt states she feels shame after eating 5 times a week and does not feel she has control over the amount of food she eats: after further discussion she states she can stop herself she just gets upset that she emotionally ate to begin with.  Pt states she has been upset the last year because she cannot lose weight stating she has no other stress in her life.  Pt states about 3 years ago she used laxatives to lose weight and realizes now that is not healthy for her colon so she will not do that again.   24-Hr Dietary Recall First Meal: bagel + butter Snack:  Second Meal: roast beef sandwich + mayo Snack: chex mix Third Meal: pork chops + sweet potato + alfredo Snack: cheese + candy Beverages: water + flavoring, milk, tea,  lemonade, gatorade   Estimated Energy Needs Calories: 1500   NUTRITION DIAGNOSIS  Overweight/obesity ([place]-3.3) related to past poor dietary habits and physical inactivity as evidenced by patient w/ planned RYGB surgery following dietary guidelines for continued weight loss.    NUTRITION INTERVENTION  Nutrition counseling (C-1) and education (E-2) to facilitate bariatric surgery goals.  Educated pt on micronutrient deficiencies post surgery and strategies to mitigate that risk   Pre-Op Goals Reviewed with the Patient Track food and beverage intake (pen and paper, MyFitness Pal, Baritastic app, etc.) Make healthy food choices while monitoring portion sizes Consume 3 meals per day or try to eat every 3-5 hours Avoid concentrated sugars and fried foods Keep sugar & fat in the single digits per serving on food labels Practice CHEWING your food (aim for applesauce consistency) Practice not drinking 15 minutes before, during, and 30 minutes after each meal and snack Avoid all carbonated beverages (ex: soda, sparkling beverages)  Limit caffeinated beverages (ex: coffee, tea, energy drinks) Avoid all sugar-sweetened beverages (ex: regular soda, sports drinks)  Avoid alcohol  Aim for 64-100 ounces of FLUID daily (with at least half of fluid intake being plain water)  Aim for at least 60-80 grams of PROTEIN daily Look for a liquid protein source that contains ?15 g protein and ?5 g carbohydrate (ex: shakes, drinks, shots) Make a list of non-food related activities Physical activity is an important part of a healthy lifestyle so keep it moving! The goal is to reach 150 minutes of exercise per week, including cardiovascular and weight baring  activity.  *Goals that are bolded indicate the pt would like to start working towards these  Handouts Provided Include  Bariatric Surgery handouts (Nutrition Visits, Pre-Op Goals, Protein Shakes, Vitamins & Minerals)  Learning Style & Readiness for  Change Teaching method utilized: Visual & Auditory  Demonstrated degree of understanding via: Teach Back  Readiness Level: contemplative  Barriers to learning/adherence to lifestyle change: unknown at this time     MONITORING & EVALUATION Dietary intake, weekly physical activity, body weight, and pre-op goals reached at next nutrition visit.    Next Steps  Patient is to follow up at [facility] for Pre-Op Class >2 weeks before surgery for further nutrition education.  Pt has completed visits. No further supervised visits required/recomended

## 2021-05-24 ENCOUNTER — Other Ambulatory Visit: Payer: Self-pay

## 2021-05-24 ENCOUNTER — Ambulatory Visit (HOSPITAL_COMMUNITY)
Admission: RE | Admit: 2021-05-24 | Discharge: 2021-05-24 | Disposition: A | Discharge status: Home / Self Care | Payer: Medicare Other | Source: Ambulatory Visit | Attending: Surgery | Admitting: Surgery
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
## 2021-05-28 ENCOUNTER — Ambulatory Visit: Payer: BC Managed Care – PPO | Admitting: Allergy

## 2021-07-10 ENCOUNTER — Ambulatory Visit (INDEPENDENT_AMBULATORY_CARE_PROVIDER_SITE_OTHER): Payer: Medicare Other | Admitting: Psychology

## 2021-07-10 NOTE — Progress Notes (Addendum)
Date of Evaluation: 07/10/2021 ?Time Seen: 1pm ?Duration: 85 minutes ?Type of Session: Inittial Appointment for Evaluation  ?Location of Patient: Home (private location) ?Location of Provider: At home in a private office due to COVID-19 pandemic ?Type of Contact: WebEx video visit with audio ? ?Prior to proceeding with today's appointment, two pieces of identifying information were obtained from [name] to verify identity. In addition, [name]'s physical location at the time of this appointment was obtained. In the event of technical difficulties, Kam shared a phone number they could be reached at. Ellard Artis and this provider participated in today's telepsychological service. Kiyona denied anyone else being present in the room or on the virtual appointment.The provider's role was explained to [name]. The provider reviewed and discussed issues of confidentiality, privacy, and limits therein (e.g., reporting obligations). In addition to verbal informed consent, written informed consent for psychological services was obtained from [name] prior to the initial intake interview. Written consent included information concerning the practice, financial arrangements, and confidentiality and patients' rights. Since the clinic is not a 24/7 crisis center, mental health emergency resources were shared, and the provider explained MyChart, e-mail, voicemail, and/or other messaging systems should be utilized only for non-emergency reasons. This provider also explained that information obtained during appointments will be placed in their electronic medical chart in a confidential manner. [name] verbally acknowledged understanding of the aforementioned and agreed to use mental health emergency resources discussed if needed. Moreover, [name] agreed information may be shared with other [place] and [facility] providers as needed for coordination of care. By signing the new patient documents, [name] provided written  consent for coordination of care. Kesi verbally acknowledged understanding she is ultimately responsible for understanding her insurance benefits as it relates to reimbursement of telepsychological and in-person services. This provider also reviewed confidentiality, as it relates to telepsychological services, as well as the rationale for telepsychological services. More specifically, this provider's clinic is providing telepsychological services to reduce exposure to COVID-19. The patient expressed understanding regarding the rationale for telepsychological services. [name] verbally consented to proceed. ? ?[name] completed the psychiatric diagnostic evaluation (complete history, including past, family, and social history, psychiatric history, and establishment of a tentative diagnosis) and the bariatric assessment for a total of 85 minutes. Code 81829 was billed.  ? ?Additionally, during this initial appointment this provider completed the scoring of the following measures: Beck Anxiety Inventory (BAI) (5 minutes), Beck?s Depression Index (5 minutes), Eating Disorder Diagnostic Scale (EDDS) (10 minutes [administration and scoring]), Mini Mental Status Examination (MMSE) (10 minutes [administration and scoring]), & Mood Disorder Questionnaire (MDQ) (5 minutes [administration and scoring]). A total of 35 minutes was spent on the scoring of the aforementioned measures and code 93716 was billed.  ? ?Mental Status Examination:  ?Appearance:  neat ?Behavior: appropriate to circumstances ?Mood: euthymic ?Affect: mood congruent ?Speech: WNL ?Eye Contact: appropriate ?Psychomotor Activity: WNL ?Thought Process: linear, logical, and goal directed and denies suicidal, homicidal, and self-harm ideation, plan and intent ?Content/Perceptual Disturbances: none ?Orientation: AAOx4 ?Cognition/Sensorium: intact ?Insight: good ?Judgment: good ? ?Time Requirements: ?Interview: 85 minutes (billing code 96789) ?Assessment scoring and  interpreting: 35 minutes (billing code 38101) ? ?DSM-5 Diagnosis(es) Code: E66.01 Morbid Obesity ? ?Plan: [name] is currently scheduled for a feedback appointment on 07/24/2021 at 11am via WebEx video visit with audio.  ? ? ?Bariatric Evaluation  ?  ?CONFIDENTIAL  ?  ?Client Name: Angela Arroyo  MRN: [id number] ?Date of Birth:  Oct 20, 1965                                                             Date of Evaluation: 07/10/2021 ?Total Assessment Time: 55 Minutes                                           Date of Report: 07/12/2021 ?Evaluator: Helmut Muster, Psy.D.                                 Referring Physician: Dr. Phylliss Blakes, [facility] ? ?Reason for Referral: [name] reported she was referred for an evaluation to ?check mental wellbeing before [she has] the surgery, and to see as far as if [she] would do harm to [herself] before or after the surgery.? Per referral paperwork, ?she endorses a fairly well-balanced eating style? and ?Roux-en-Y gastric bypass rather than sleeve? is recommended.  ? ?Sources of Information ?Clinical Interview ?Bariatric Questionnaire ?[place] Interview for Gastric Bypass ?Beck Anxiety Inventory (BAI) ?Beck Depression Inventory, 2nd Edition (BDI-II) ?Eating Disorder Diagnostic Scale (EDDS) ?Mini-Mental State Examination (MMSE)  ?Mood Disorder Questionnaire (MDQ) ?Review of Medical Record (provided by [facility]) ? ?Patient Identification and Chief Complaint: Angela Arroyo currently resides in [place], [place], noting she lives with her husband whom she gets along with. She stated she has an associate degree and denied a history of learning diagnosis or grade retentions. 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  ? ?Angela Arroyo discussed a belief weight loss may help with improving confidence, engaging in activities with her grandchildren, and ?feel good  about? about herself and the ?way [she] looks.? She shared her social support system consists of her husband and mother. Ms. Cappelletti reported a belief there would be a positive impact on her relationships if she were to lose weight, adding it would ?give [her] more energy to participate in more in events with others? and improve her health.  ?She described herself and her husband as the primary cooks in the house.   ? ?Ms. Janee Morn and referral paperwork reported her medical history is significant for the following: hypertension, hyperlipidemia, chronic pain syndrome, stage II chronic kidney disease, glomerulonephritis, arthritis, GERD, and sleep apnea. Ms. Janee Morn and referral paperwork noted her surgical history is significant for cholecystectomy, ovarian cyst removal, tubal litigation and subsequent reversal, vaginal hysterectomy, urinary sling with subsequent revision, and ?tummy tuck.? Her family medical history is significant for breast cancer, stroke, skin cancer, hypertension, obesity, hyperlipidemia, heart valve disease, and obesity. Ms. Cales reported she is medication compliant and does not have any issues with her current medications.  ? ?Current Medications: ?Pregabalin 150MG  ?Tizanidine 4MG  ?Bupropion 300MG  XL ?Adderall XR 25MG  ?Hydrocodone-Acetaminophen 5-325MG  ?Potassium Chloride ER ?Pregablin 150MG  ?Valacyclovir 1000MG  ? ?Ms. Easom endorsed a history of ADHD and use of Adderall which she described as helpful. She also endorsed being diagnosed with depression and experienced passive suicidal ideation in November of 2022, which she attributed to concerns about her weight and medical issues as well  as frustration with her efforts not leading to weight loss. She denied any other instances of depressive episode, suicidal ideation, or having ever experienced suicidal plan or intent. She expressed confidence in her ability to refrain from self-harm and plans to contact trusted others and/or  emergency services should she ever become concerned about her ability to keep herself safe. She noted during periods of low mood she occasionally engages in emotional eating-related concerns but this behavior

## 2021-07-16 ENCOUNTER — Encounter: Payer: Self-pay | Admitting: Allergy

## 2021-07-16 ENCOUNTER — Ambulatory Visit (INDEPENDENT_AMBULATORY_CARE_PROVIDER_SITE_OTHER): Payer: Medicare Other | Admitting: Allergy

## 2021-07-16 VITALS — BP 126/82 | HR 101 | Resp 16 | Ht 65.0 in | Wt 225.8 lb

## 2021-07-16 DIAGNOSIS — H1013 Acute atopic conjunctivitis, bilateral: Secondary | ICD-10-CM | POA: Diagnosis not present

## 2021-07-16 DIAGNOSIS — J3089 Other allergic rhinitis: Secondary | ICD-10-CM | POA: Diagnosis not present
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
## 2021-07-16 NOTE — Patient Instructions (Signed)
-   Testing today showed: dust mites. ?- Copy of test results provided.  ?- Avoidance measures provided. ?- Stop taking: Zyrtec ?- Continue with: eyedrop you are currently using  ?- Start taking: Xyzal (levocetirizine) 5mg  tablet once daily.  This is a long-acting antihistamine they may be more effective than Zyrtec without side effects.  ?Ryaltris (olopatadine/mometasone) two sprays per nostril 1-2 times daily for nasal congestion or drainage control.  This is a combination spray that has olopatadine (nasal antihistamine for drainage control) and mometasone (nasal steroid for congestion control).  If this is not covered with insurance then will prescribe the sprays separately.  ?Pataday (olopatadine) one drop per eye twice daily as needed for itchy/watery eyes if your current eyedrop become ineffective.  ?- If medication management is not effective consider completing allergy testing with blood work to confirm skin testing results.  ?- You can use an extra dose of the antihistamine, if needed, for breakthrough symptoms.  ?- Consider nasal saline rinses 1-2 times daily to remove allergens from the nasal cavities as well as help with mucous clearance (this is especially helpful to do before the nasal sprays are given) ?- Consider allergy shots as a means of long-term control. ?- Allergy shots "re-train" and "reset" the immune system to ignore environmental allergens and decrease the resulting immune response to those allergens (sneezing, itchy watery eyes, runny nose, nasal congestion, etc).    ?- Allergy shots improve symptoms in 80-85% of patients.  ? ?Follow-up in 4-6 months or sooner if needed ?

## 2021-07-24 ENCOUNTER — Ambulatory Visit (INDEPENDENT_AMBULATORY_CARE_PROVIDER_SITE_OTHER): Payer: Medicare Other | Admitting: Psychology

## 2021-07-24 DIAGNOSIS — F509 Eating disorder, unspecified: Secondary | ICD-10-CM

## 2021-07-24 NOTE — Progress Notes (Signed)
Date of Appointment: 07/24/2021  ?Time Seen: 11am ?Duration: 20 total minutes ?Type of Session: Follow-up Appointment for Evaluation  ?Location of Patient: Home (private location) ?Location of Provider: At home in a private office due to COVID-19 pandemic ?Type of Contact: WebEx video visit with audio ? ?[name] participated in the session via WebEx video visit with audio, from the privacy of their home due to the COVID-19 pandemic. Angela Arroyo provided verbal consent to proceed with today's appointment. This provider participated from a private home office. Today's interactive feedback session was completed which included reviewing the following measures: Beck Anxiety Inventory (BAI), Beck?s Depression Index (BDI), Eating Disorder Diagnostic Scale (EDDS), Mental Status Examination (MSE), & Mood Disorder Questionnaire (MDQ). During this interactive feedback session, this provider and Angela Arroyo discussed the results of the aforementioned measures, treatment recommendations, and the final determination regarding the psychological approval for bariatric surgery.  ? ?Please see the bariatric assessment at the bottom of this note for additional details. This provider completed the written report which includes integration of patient data, interpretation of standardized test results, interpretation of clinical data, review of referral from surgeon & clinical decision making (110 minutes in total). ? ?The interactive feedback session was completed today and a total of 20 minutes was spent on feedback and report writing. No billing code for the feedback appointment. ? ? ?Time Requirements: ?Feedback: 20 total minutes (no billing code) ?Report writing: 110 total minutes. 07/07/2021: 8:10-8:45am. 07/11/2021: 2:50-3:05pm. 07/12/2021: 8:40-9am, 10:45-11am, and 12:20-12:45pm (billing codes 62130 and [phone]) ? ?DSM-5 Diagnosis(es) code: E66.01 Morbid Obesity ? ?Plan: Angela Arroyo provided verbal consent for her evaluation to be sent to [facility]. No further follow-up planned by this provider.   ? ? ?Bariatric Evaluation  ?  ?CONFIDENTIAL  ?  ?Client Name: Angela Arroyo                       MRN: [id number] ?Date of Birth:  04/02/1966                                                             Date of Evaluation: 07/10/2021 ?Total Assessment Time: 70 Minutes                                           Date of Report: 07/24/2021 ?Evaluator: Helmut Muster, Psy.D.                                 Referring Physician: Dr. Phylliss Blakes, [facility] ? ?Reason for Referral: Ms. Angela Arroyo reported she was referred for an evaluation to ?check mental wellbeing before [she has] the surgery, and to see as far as if [she] would do harm to [herself] before or after the surgery.? Per referral paperwork, ?she endorses a fairly well-balanced eating style? and ?Roux-en-Y gastric bypass rather than sleeve? is recommended.  ? ?Sources of Information ?Clinical Interview ?Bariatric Questionnaire ?[place] Interview for Gastric Bypass ?Beck Anxiety Inventory (BAI) ?Beck Depression Inventory, 2nd Edition (BDI-II) ?Eating Disorder Diagnostic Scale (EDDS) ?Mini-Mental State Examination (MMSE)  ?Mood Disorder Questionnaire (MDQ) ?Review of Medical Record (provided by [facility]) ? ?  Patient Identification and Chief Complaint: Ms. Angela Ar[place], noting she lives with her husband whom she gets along with. She stated she has an associate degree and denied a history of learning diagnosis or grade retentions. Ms. 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  ? ?Ms. Angela Arroyo discussed a belief weight loss may help with improving confidence, engaging in activities with her grandchildren, and ?feel good about? about herself and the ?way [she] looks.? She shared her social support system consists of her husband and mother. Ms. Angela Arroyo reported a belief  there would be a positive impact on her relationships if she were to lose weight, adding it would ?give [her] more energy to participate in more in events with others? and improve her health.  ?She described herself and her husband as the primary cooks in the house.   ? ?Ms. Angela Arroyo and referral paperwork reported her medical history is significant for the following: hypertension, hyperlipidemia, chronic pain syndrome, stage II chronic kidney disease, glomerulonephritis, arthritis, GERD, and sleep apnea. Ms. Angela Arroyo and referral paperwork noted her surgical history is significant for cholecystectomy, ovarian cyst removal, tubal litigation and subsequent reversal, vaginal hysterectomy, urinary sling with subsequent revision, and ?tummy tuck.? Her family medical history is significant for breast cancer, stroke, skin cancer, hypertension, obesity, hyperlipidemia, heart valve disease, and obesity. Ms. Angela Arroyo reported she is medication compliant and does not have any issues with her current medications.  ? ?Current Medications: ?Pregabalin 150MG  ?Tizanidine 4MG  ?Bupropion 300MG  XL ?Adderall XR 25MG  ?Hydrocodone-Acetaminophen 5-325MG  ?Potassium Chloride 10MEQ ER ?Pregablin 150MG  ?Valacyclovir 1000MG  ? ?Ms. Angela Arroyo endorsed a history of ADHD and use of Adderall which she described as helpful. She also endorsed being diagnosed with depression and experienced passive suicidal ideation in November of 2022, which she attributed to concerns about her weight and medical issues as well as frustration with her efforts not leading to weight loss. She denied any other instances of depressive episode, suicidal ideation, or having ever experienced suicidal plan or intent. She expressed confidence in her ability to refrain from self-harm and plans to contact trusted others and/or emergency services should she ever become concerned about her ability to keep herself safe. She noted during periods of low mood she occasionally engages  in emotional eating-related concerns but this behavior significantly reduced with use of Wellbutrin. She denied ever utilizing mental health services or experiencing panic attacks, psychiatric hospitalization, or abuse or neglect.  She noted ?social? use of two-to-three standard size drinks of alcohol a month, adding confidence in her ability to cease its use after bariatric surgery. She denied use of all other recreational or illicit substances. She reported a familial mental health history that is significant for ADHD. ? ?Patient's Understanding of the Procedure/Risks of Surgery: Ms. Angela Arroyo reported she has ?heard stories from others? about their experiences with bariatric surgery, adding she has no significant concerns about the information she received. She stated she is pursuing Roux-en-Y gastric bypass which involves ?small part of stomach is separated from the rest of stomach that is stapled? and ?bypass the small intestines and connect large intestine [to stomach].? ? ?She described awareness of anesthesia being administered during the procedure and noted the surgery can cause ?issues with staples,? leakage, sepsis, death, nutritional deficiencies, ?issues with wounds,? ?blockage,? and stomach ?bursts.? She reported there would be a postoperative hospital stay and she would be unable to return to work/daily activities for two-to-three weeks. She  reported her primary motivations for the procedure include increased day-to-day mobility, feeling more comfortable when socializing with others, improved health, improved appearance, and enhanced relationship with her partner. She expressed an expectation to lose 35-40 pounds following the surgery, noting maximum weight loss could take at least ?three-to-four months.?  ? ?Ms. Louthan reported confidence in her ability to implement food restrictions following gastric surgery, stating she got puppies to help keep her ?up and moving,? her husband is supportive and  planning to follow a similar meal plan, they are buying ?meal kits? with appropriate food options, she reminds herself of her long-term goals, and has plans to chew her food more carefully. She expressed confi

## 2021-07-26 ENCOUNTER — Ambulatory Visit: Payer: Self-pay | Admitting: Surgery

## 2021-07-26 NOTE — H&P (Signed)
?Angela Arroyo ?[id number] ?Referring Provider:  Self ? ? ?Subjective  ? ?Chief Complaint: Weight Loss  (Pre - op) ?  ? ? ?History of Present Illness: ?Follow-up today for further discussion regarding her decision regarding surgical treatment of morbid obesity.  She has completed the pathway with no barriers identified.  She has a few insightful questions to discuss today. ? ?UGI/ CXR: small intermittent type I hiatal hernia, otherwise normal ?Labs-creatinine 1.3, hemoglobin A1c 5.8, cholesterol and triglycerides elevated, ?Dietician- cleared ? 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 ?She was initially considering abdominoplasty but was told that her BMI was too high to proceed with this.  She has considered bariatric surgery in the past.  She has tried multiple diets and medications, most recently Saxenda, without significant success. ?She endorses a fairly well-balanced eating style.  Denies any specific issues with emotional eating, binge eating, etc. Reports her activity level has been increased lately but no specific exercise regimen. ?She does endorse reflux.  This is somewhat controlled on Dexilant.  She has history of requiring esophageal dilation, last EGD was 5 or 6 years ago. ?Previous abdominal surgeries include cholecystectomy, ovarian cyst removal, tubal ligation and subsequent reversal, vaginal hysterectomy, urinary sling with subsequent revision, tummy tuck ? ?Lives at home with her husband.  She often watches her grandkids, age ranges 2-6, and has a new miniature burnadoodle puppy at home that is keeping her very active. ? ?Review of Systems: ?A complete review of systems  was obtained from the patient.  I have reviewed this information and discussed as appropriate with the patient.  See HPI as well for other ROS. ? ? ?Medical History: ?Past Medical History:  ?Diagnosis Date  ? Arthritis   ? Chronic kidney disease   ? GERD (gastroesophageal reflux disease)   ? Hyperlipidemia   ? Hypertension   ? Sleep apnea   ? ? ?There is no problem list on file for this patient. ? ? ?Past Surgical History:  ?Procedure Laterality Date  ? gallbladder surgery    ? HYSTERECTOMY    ? mesh removal surgery N/A   ? tubal reversal surgery N/A   ? tummy tuck surgery N/A   ? vaginal mesh removal  N/A   ?  ? ?Allergies  ?Allergen Reactions  ? Oxycodone-Acetaminophen Hives and Itching  ?  Other reaction(s): hives ?  ? Ace Inhibitors Other (See Comments)  ?  cough ?cough ?Other reaction(s): Other (See Comments) ?cough ?cough ?Other reaction(s): Cough (finding) ?  ? Acetaminophen Unknown  ? Oxycodone (Bulk) Unknown  ? ? ?Current Outpatient Medications on File Prior to Visit  ?Medication Sig Dispense Refill  ? buPROPion (WELLBUTRIN XL) 300 MG XL tablet     ? dextroamphetamine-amphetamine (ADDERALL XR) 25 MG XR capsule Take by mouth once daily    ? HYDROcodone-acetaminophen (NORCO) 5-325 mg tablet Take by mouth    ? potassium chloride (MICRO-K) 10 MEQ ER capsule     ? pregabalin (LYRICA) 150 MG capsule     ? pregabalin (LYRICA) 150 MG capsule Take by mouth    ? tiZANidine (ZANAFLEX) 4 MG tablet 1/2 - 1 tab Q 6hrs prn    ? valACYclovir (VALTREX) 1000 MG tablet     ? ?No current facility-administered  medications on file prior to visit.  ? ? ?Family History  ?Problem Relation Age of Onset  ? Breast cancer Mother   ? Stroke Father   ? Skin cancer Father   ? High blood pressure (Hypertension) Father   ? Obesity Father   ? Hyperlipidemia (Elevated cholesterol) Father   ? Heart valve disease Father   ? Obesity Sister   ? High blood pressure (Hypertension) Sister   ? Hyperlipidemia (Elevated cholesterol) Sister   ? Obesity  Brother   ? High blood pressure (Hypertension) Brother   ? Hyperlipidemia (Elevated cholesterol) Brother   ?  ? ?Social History  ? ?Tobacco Use  ?Smoking Status Former  ? Types: Cigarettes  ?Smokeless Tobacco Never  ?  ? ?Social History  ? ?Socioeconomic History  ? Marital status: Married  ?Tobacco Use  ? Smoking status: Former  ?  Types: Cigarettes  ? Smokeless tobacco: Never  ?Vaping Use  ? Vaping Use: Never used  ?Substance and Sexual Activity  ? Alcohol use: Yes  ? Drug use: Never  ? ? ?Objective:  ? ? ?Vitals:  ? 07/26/21 1044  ?BP: 130/70  ?Pulse: 96  ?Temp: 36.3 ?C (97.4 ?F)  ?SpO2: 98%  ?Weight: (!) 102.2 kg (225 lb 6.4 oz)  ?Height: 165.1 cm (5\' 5" )  ?  ?Body mass index is 37.51 kg/m?. ? ?Well-appearing ?Unlabored respirations ? ? ?Assessment and Plan:  ?Diagnoses and all orders for this visit: ? ?Morbid obesity (CMS-HCC) ? ?Gastroesophageal reflux disease, unspecified whether esophagitis present ? ?Essential (primary) hypertension ? ?Other irritable bowel syndrome ? ?Hyperlipidemia, unspecified hyperlipidemia type ? ?Recurrent major depressive disorder, remission status unspecified (CMS-HCC) ? ?Attention deficit disorder, unspecified hyperactivity presence ? ?Stage 3 chronic kidney disease, unspecified whether stage 3a or 3b CKD (CMS-HCC) ? ?  ?She has completed the bariatric pathway with no barriers identified.  Her upper GI does show a small hiatal hernia which may be contributing a fair amount to her reflux.  We had a long discussion again today about the merits versus trade-offs of sleeve versus bypass.  She understands well that sleeve does carry some risk of worsening reflux and we also went over the possible downsides of gastric bypass.  I think she has excellent understanding at this point and still wishes to proceed with sleeve which I do think is reasonable.  We will proceed with sleeve gastrectomy hiatal hernia repair. ? ?Angela Maestre Raquel James, MD  ? ? ? ?

## 2021-08-12 ENCOUNTER — Encounter: Payer: Medicare Other | Attending: Surgery | Admitting: Skilled Nursing Facility1

## 2021-08-12 DIAGNOSIS — Z6837 Body mass index (BMI) 37.0-37.9, adult: Secondary | ICD-10-CM | POA: Diagnosis not present

## 2021-08-12 DIAGNOSIS — Z713 Dietary counseling and surveillance: Secondary | ICD-10-CM | POA: Diagnosis not present

## 2021-08-12 DIAGNOSIS — E669 Obesity, unspecified: Secondary | ICD-10-CM | POA: Insufficient documentation

## 2021-08-12 DIAGNOSIS — N1831 Chronic kidney disease, stage 3a: Secondary | ICD-10-CM | POA: Diagnosis not present

## 2021-08-12 NOTE — Progress Notes (Signed)
Pre-Operative Nutrition Class:   ? ?Patient was seen on 08/12/2021 for Pre-Operative Bariatric Surgery Education at the [facility].   ? ?Surgery date: 08/27/2021 ?Surgery type: Sleeve Gastrectomy  ?Start weight at [facility]: 223.8 ?Weight today: 227.3 ? ?Samples given per MNT protocol. Patient educated on appropriate usage: ?Bariatric Advantage Multivitamin ?Lot # [id number] ?Exp:08/24 ?  ?Bariatric Advantage Calcium  ?Lot #[id number] ?Exp: 02/17/2022 ?  ?Protein Powder ?Lot # [id number] ?Exp: 02/24 ?  ?Protein Shake ?Lot # [id number] ?Exp: [id number] ? ?The following the learning objectives were met by the patient during this course: ?Identify Pre-Op Dietary Goals and will begin 2 weeks pre-operatively ?Identify appropriate sources of fluids and proteins  ?State protein recommendations and appropriate sources pre and post-operatively ?Identify Post-Operative Dietary Goals and will follow for 2 weeks post-operatively ?Identify appropriate multivitamin and calcium sources ?Describe the need for physical activity post-operatively and will follow MD recommendations ?State when to call healthcare provider regarding medication questions or post-operative complications ?When having a diagnosis of diabetes understanding hypoglycemia symptoms and the inclusion of 1 complex carbohydrate per meal ? ?Handouts given during class include: ?Pre-Op Bariatric Surgery Diet Handout ?Protein Shake Handout ?Post-Op Bariatric Surgery Nutrition Handout ?BELT Program Information Flyer ?Support Group Information Flyer ?[facility] Bariatric Supplements Price List ? ?Follow-Up Plan: ?Patient will follow-up at [facility] 2 weeks post operatively for diet advancement per MD.  ?

## 2021-08-19 NOTE — Progress Notes (Addendum)
DUE TO COVID-19 ONLY  2  VISITOR IS ALLOWED TO COME WITH YOU AND STAY IN THE WAITING ROOM ONLY DURING PRE OP AND PROCEDURE DAY OF SURGERY.   4 VISITOR  MAY VISIT WITH YOU AFTER SURGERY IN YOUR PRIVATE ROOM DURING VISITING HOURS ONLY! ?YOU MAY HAVE ONE PERSON SPEND THE NITE WITH YOU IN YOUR ROOM AFTER SURGERY.   ? ? ? ? Your procedure is scheduled on:  ?     08/27/2021  ? Report to [facility] Main  Entrance ? ? Report to admitting at     0830           AM ?DO NOT BRING INSURANCE CARD, PICTURE ID OR WALLET DAY OF SURGERY.  ?  ? ? Call this number if you have problems the morning of surgery [phone]  ? ? REMEMBER: NO  SOLID FOODS , CANDY, GUM OR MINTS AFTER MIDNITE THE NITE BEFORE SURGERY .       Marland Kitchen CLEAR LIQUIDS UNTIL     0745am            DAY OF SURGERY.      PLEASE FINISH ENSURE DRINK PER SURGEON ORDER  WHICH NEEDS TO BE COMPLETED AT    0745am        MORNING OF SURGERY.   ? ? ? ? ?CLEAR LIQUID DIET ? ? ?Foods Allowed      ?WATER ?BLACK COFFEE ( SUGAR OK, NO MILK, CREAM OR CREAMER) REGULAR AND DECAF  ?TEA ( SUGAR OK NO MILK, CREAM, OR CREAMER) REGULAR AND DECAF  ?PLAIN JELLO ( NO RED)  ?FRUIT ICES ( NO RED, NO FRUIT PULP)  ?POPSICLES ( NO RED)  ?JUICE- APPLE, WHITE GRAPE AND WHITE CRANBERRY  ?SPORT DRINK LIKE GATORADE ( NO RED)  ?CLEAR BROTH ( VEGETABLE , CHICKEN OR BEEF)                                                               ? ?    ? ?BRUSH YOUR TEETH MORNING OF SURGERY AND RINSE YOUR MOUTH OUT, NO CHEWING GUM CANDY OR MINTS. ?  ? ? Take these medicines the morning of surgery with A SIP OF WATER:  wellbutrin, xyzal, lyrica, dexilant  ? ? ?DO NOT TAKE ANY DIABETIC MEDICATIONS DAY OF YOUR SURGERY ?                  ?            You may not have any metal on your body including hair pins and  ?            piercings  Do not wear jewelry, make-up, lotions, powders or perfumes, deodorant ?            Do not wear nail polish on your fingernails.   ?           IF YOU ARE A FEMALE AND WANT TO SHAVE UNDER  ARMS OR LEGS PRIOR TO SURGERY YOU MUST DO SO AT LEAST 48 HOURS PRIOR TO SURGERY.  ?            Men may shave face and neck. ? ? Do not bring valuables to the hospital. [facility] IS NOT ?  RESPONSIBLE   FOR VALUABLES. ? Contacts, dentures or bridgework may not be worn into surgery. ? Leave suitcase in the car. After surgery it may be brought to your room. ? ?  ? Patients discharged the day of surgery will not be allowed to drive home. IF YOU ARE HAVING SURGERY AND GOING HOME THE SAME DAY, YOU MUST HAVE AN ADULT TO DRIVE YOU HOME AND BE WITH YOU FOR 24 HOURS. YOU MAY GO HOME BY TAXI OR UBER OR ORTHERWISE, BUT AN ADULT MUST ACCOMPANY YOU HOME AND STAY WITH YOU FOR 24 HOURS. ?  ? ?            Please read over the following fact sheets you were given: ?_____________________________________________________________________ ? ?[facility] - Preparing for Surgery ?Before surgery, you can play an important role.  Because skin is not sterile, your skin needs to be as free of germs as possible.  You can reduce the number of germs on your skin by washing with CHG (chlorahexidine gluconate) soap before surgery.  CHG is an antiseptic cleaner which kills germs and bonds with the skin to continue killing germs even after washing. ?Please DO NOT use if you have an allergy to CHG or antibacterial soaps.  If your skin becomes reddened/irritated stop using the CHG and inform your nurse when you arrive at Short Stay. ?Do not shave (including legs and underarms) for at least 48 hours prior to the first CHG shower.  You may shave your face/neck. ?Please follow these instructions carefully: ? 1.  Shower with CHG Soap the night before surgery and the  morning of Surgery. ? 2.  If you choose to wash your hair, wash your hair first as usual with your  normal  shampoo. ? 3.  After you shampoo, rinse your hair and body thoroughly to remove the  shampoo.                           4.  Use CHG as you would any other liquid soap.  You  can apply chg directly  to the skin and wash  ?                     Gently with a scrungie or clean washcloth. ? 5.  Apply the CHG Soap to your body ONLY FROM THE NECK DOWN.   Do not use on face/ open      ?                     Wound or open sores. Avoid contact with eyes, ears mouth and genitals (private parts).  ?                     Production manager,  Genitals (private parts) with your normal soap. ?            6.  Wash thoroughly, paying special attention to the area where your surgery  will be performed. ? 7.  Thoroughly rinse your body with warm water from the neck down. ? 8.  DO NOT shower/wash with your normal soap after using and rinsing off  the CHG Soap. ?               9.  Pat yourself dry with a clean towel. ?           10.  Wear clean pajamas. ?  11.  Place clean sheets on your bed the night of your first shower and do not  sleep with pets. ?Day of Surgery : ?Do not apply any lotions/deodorants the morning of surgery.  Please wear clean clothes to the hospital/surgery center. ? ?FAILURE TO FOLLOW THESE INSTRUCTIONS MAY RESULT IN THE CANCELLATION OF YOUR SURGERY ?PATIENT SIGNATURE_________________________________ ? ?NURSE SIGNATURE__________________________________ ? ?________________________________________________________________________  ? ? ?           ?

## 2021-08-19 NOTE — Progress Notes (Addendum)
Anesthesia Review: ? ?PCP: Donata Duff  in [place]  ?Kidney BMI  in [place]  ?Cardiologist : none  ?Chest x-ray : 05/24/21  ?EKG :05/24/21  ?Echo : ?Stress test: ?Cardiac Cath :  ?Activity level: can do a flight of stairs without difficulty  ?Sleep Study/ CPAP : has cpap  ?Fasting Blood Sugar :      / Checks Blood Sugar -- times a day:   ?Blood Thinner/ Instructions /Last Dose: ?ASA / Instructions/ Last Dose :   ?

## 2021-08-21 ENCOUNTER — Encounter (HOSPITAL_COMMUNITY): Payer: Self-pay

## 2021-08-21 ENCOUNTER — Other Ambulatory Visit: Payer: Self-pay

## 2021-08-21 ENCOUNTER — Encounter (HOSPITAL_COMMUNITY)
Admission: RE | Admit: 2021-08-21 | Discharge: 2021-08-21 | Disposition: A | Discharge status: Home / Self Care | Payer: Medicare Other | Source: Ambulatory Visit | Attending: Surgery | Admitting: Surgery

## 2021-08-21 DIAGNOSIS — Z01812 Encounter for preprocedural laboratory examination: Secondary | ICD-10-CM | POA: Diagnosis present

## 2021-08-21 HISTORY — DX: Chronic kidney disease, unspecified: N18.9

## 2021-08-21 HISTORY — DX: Depression, unspecified: F32.A

## 2021-08-21 HISTORY — DX: Unspecified osteoarthritis, unspecified site: M19.90

## 2021-08-21 HISTORY — DX: Family history of other specified conditions: Z84.89

## 2021-08-21 LAB — COMPREHENSIVE METABOLIC PANEL
ALT: 31 U/L (ref 0–44)
AST: 32 U/L (ref 15–41)
Albumin: 4.1 g/dL (ref 3.5–5.0)
Alkaline Phosphatase: 94 U/L (ref 38–126)
Anion gap: 7 (ref 5–15)
BUN: 24 mg/dL — ABNORMAL HIGH (ref 6–20)
CO2: 29 mmol/L (ref 22–32)
Calcium: 9.5 mg/dL (ref 8.9–10.3)
Chloride: 103 mmol/L (ref 98–111)
Creatinine, Ser: 1.34 mg/dL — ABNORMAL HIGH (ref 0.44–1.00)
GFR, Estimated: 47 mL/min — ABNORMAL LOW (ref >60)
Glucose, Bld: 92 mg/dL (ref 70–99)
Potassium: 3.7 mmol/L (ref 3.5–5.1)
Sodium: 139 mmol/L (ref 135–145)
Total Bilirubin: 0.5 mg/dL (ref 0.3–1.2)
Total Protein: 7.8 g/dL (ref 6.5–8.1)

## 2021-08-21 LAB — CBC WITH DIFFERENTIAL/PLATELET
Abs Immature Granulocytes: 0.02 10*3/uL (ref 0.00–0.07)
Basophils Absolute: 0 10*3/uL (ref 0.0–0.1)
Basophils Relative: 0 %
Eosinophils Absolute: 0.2 10*3/uL (ref 0.0–0.5)
Eosinophils Relative: 2 %
HCT: 38.4 % (ref 36.0–46.0)
Hemoglobin: 13.3 g/dL (ref 12.0–15.0)
Immature Granulocytes: 0 %
Lymphocytes Relative: 42 %
Lymphs Abs: 2.8 10*3/uL (ref 0.7–4.0)
MCH: 30.4 pg (ref 26.0–34.0)
MCHC: 34.6 g/dL (ref 30.0–36.0)
MCV: 87.9 fL (ref 80.0–100.0)
Monocytes Absolute: 0.6 10*3/uL (ref 0.1–1.0)
Monocytes Relative: 10 %
Neutro Abs: 3 10*3/uL (ref 1.7–7.7)
Neutrophils Relative %: 46 %
Platelets: 370 10*3/uL (ref 150–400)
RBC: 4.37 MIL/uL (ref 3.87–5.11)
RDW: 12.9 % (ref 11.5–15.5)
WBC: 6.7 10*3/uL (ref 4.0–10.5)
nRBC: 0 % (ref 0.0–0.2)

## 2021-08-27 ENCOUNTER — Other Ambulatory Visit: Payer: Self-pay

## 2021-08-27 ENCOUNTER — Inpatient Hospital Stay (HOSPITAL_COMMUNITY): Payer: Medicare Other | Admitting: Anesthesiology

## 2021-08-27 ENCOUNTER — Encounter (HOSPITAL_COMMUNITY)
Admission: RE | Disposition: A | Discharge status: Home / Self Care | Payer: Self-pay | Source: Home / Self Care | Attending: Surgery

## 2021-08-27 ENCOUNTER — Encounter (HOSPITAL_COMMUNITY): Payer: Self-pay | Admitting: Surgery

## 2021-08-27 ENCOUNTER — Inpatient Hospital Stay (HOSPITAL_COMMUNITY): Payer: Medicare Other | Admitting: Physician Assistant

## 2021-08-27 ENCOUNTER — Inpatient Hospital Stay (HOSPITAL_COMMUNITY)
Admission: RE | Admit: 2021-08-27 | Discharge: 2021-08-30 | DRG: 620 | Disposition: A | Discharge status: Home / Self Care | Payer: Medicare Other | Attending: Surgery | Admitting: Surgery

## 2021-08-27 DIAGNOSIS — I9589 Other hypotension: Secondary | ICD-10-CM | POA: Diagnosis not present

## 2021-08-27 DIAGNOSIS — F909 Attention-deficit hyperactivity disorder, unspecified type: Secondary | ICD-10-CM | POA: Diagnosis present

## 2021-08-27 DIAGNOSIS — N1831 Chronic kidney disease, stage 3a: Secondary | ICD-10-CM | POA: Diagnosis present

## 2021-08-27 DIAGNOSIS — Z803 Family history of malignant neoplasm of breast: Secondary | ICD-10-CM

## 2021-08-27 DIAGNOSIS — D62 Acute posthemorrhagic anemia: Secondary | ICD-10-CM | POA: Diagnosis not present

## 2021-08-27 DIAGNOSIS — Z79899 Other long term (current) drug therapy: Secondary | ICD-10-CM | POA: Diagnosis not present

## 2021-08-27 DIAGNOSIS — Z90710 Acquired absence of both cervix and uterus: Secondary | ICD-10-CM | POA: Diagnosis not present

## 2021-08-27 DIAGNOSIS — E785 Hyperlipidemia, unspecified: Secondary | ICD-10-CM | POA: Diagnosis present

## 2021-08-27 DIAGNOSIS — Z6837 Body mass index (BMI) 37.0-37.9, adult: Secondary | ICD-10-CM

## 2021-08-27 DIAGNOSIS — N189 Chronic kidney disease, unspecified: Secondary | ICD-10-CM | POA: Diagnosis not present

## 2021-08-27 DIAGNOSIS — R000 Tachycardia, unspecified: Secondary | ICD-10-CM | POA: Diagnosis not present

## 2021-08-27 DIAGNOSIS — Z8249 Family history of ischemic heart disease and other diseases of the circulatory system: Secondary | ICD-10-CM | POA: Diagnosis not present

## 2021-08-27 DIAGNOSIS — D649 Anemia, unspecified: Secondary | ICD-10-CM | POA: Diagnosis present

## 2021-08-27 DIAGNOSIS — Z808 Family history of malignant neoplasm of other organs or systems: Secondary | ICD-10-CM

## 2021-08-27 DIAGNOSIS — E781 Pure hyperglyceridemia: Secondary | ICD-10-CM | POA: Diagnosis present

## 2021-08-27 DIAGNOSIS — M7981 Nontraumatic hematoma of soft tissue: Secondary | ICD-10-CM | POA: Diagnosis not present

## 2021-08-27 DIAGNOSIS — Z823 Family history of stroke: Secondary | ICD-10-CM

## 2021-08-27 DIAGNOSIS — K588 Other irritable bowel syndrome: Secondary | ICD-10-CM | POA: Diagnosis present

## 2021-08-27 DIAGNOSIS — G894 Chronic pain syndrome: Secondary | ICD-10-CM | POA: Diagnosis present

## 2021-08-27 DIAGNOSIS — K219 Gastro-esophageal reflux disease without esophagitis: Secondary | ICD-10-CM | POA: Diagnosis not present

## 2021-08-27 DIAGNOSIS — Z87891 Personal history of nicotine dependence: Secondary | ICD-10-CM

## 2021-08-27 DIAGNOSIS — I129 Hypertensive chronic kidney disease with stage 1 through stage 4 chronic kidney disease, or unspecified chronic kidney disease: Secondary | ICD-10-CM | POA: Diagnosis present

## 2021-08-27 DIAGNOSIS — K449 Diaphragmatic hernia without obstruction or gangrene: Secondary | ICD-10-CM | POA: Diagnosis present

## 2021-08-27 HISTORY — PX: LAPAROSCOPIC GASTRIC SLEEVE RESECTION: SHX5895

## 2021-08-27 HISTORY — PX: HIATAL HERNIA REPAIR: SHX195

## 2021-08-27 HISTORY — PX: UPPER GI ENDOSCOPY: SHX6162

## 2021-08-27 LAB — TYPE AND SCREEN
ABO/RH(D): A POS
Antibody Screen: NEGATIVE

## 2021-08-27 LAB — CBC
HCT: 28.6 % — ABNORMAL LOW (ref 36.0–46.0)
Hemoglobin: 10 g/dL — ABNORMAL LOW (ref 12.0–15.0)
MCH: 30.8 pg (ref 26.0–34.0)
MCHC: 35 g/dL (ref 30.0–36.0)
MCV: 88 fL (ref 80.0–100.0)
Platelets: 306 10*3/uL (ref 150–400)
RBC: 3.25 MIL/uL — ABNORMAL LOW (ref 3.87–5.11)
RDW: 13 % (ref 11.5–15.5)
WBC: 9.8 10*3/uL (ref 4.0–10.5)
nRBC: 0 % (ref 0.0–0.2)

## 2021-08-27 LAB — ABO/RH: ABO/RH(D): A POS

## 2021-08-27 SURGERY — GASTRECTOMY, SLEEVE, LAPAROSCOPIC
Anesthesia: General

## 2021-08-27 MED ORDER — METOCLOPRAMIDE HCL 5 MG/ML IJ SOLN
10.0000 mg | Freq: Four times a day (QID) | INTRAMUSCULAR | Status: DC
  Administered 2021-08-27 – 2021-08-30 (×11): 10 mg via INTRAVENOUS
  Filled 2021-08-27 (×11): qty 2

## 2021-08-27 MED ORDER — GABAPENTIN 300 MG PO CAPS
300.0000 mg | ORAL_CAPSULE | ORAL | Status: AC
  Administered 2021-08-27: 300 mg via ORAL
  Filled 2021-08-27: qty 1

## 2021-08-27 MED ORDER — METHOCARBAMOL 500 MG IVPB - SIMPLE MED
500.0000 mg | Freq: Four times a day (QID) | INTRAVENOUS | Status: DC | PRN
  Administered 2021-08-28: 500 mg via INTRAVENOUS
  Filled 2021-08-27: qty 500

## 2021-08-27 MED ORDER — PROPOFOL 10 MG/ML IV BOLUS
INTRAVENOUS | Status: DC | PRN
  Administered 2021-08-27: 200 mg via INTRAVENOUS

## 2021-08-27 MED ORDER — CEFOTETAN DISODIUM 2 G IJ SOLR
2.0000 g | INTRAMUSCULAR | Status: AC
  Administered 2021-08-27: 2 g via INTRAVENOUS
  Filled 2021-08-27: qty 2

## 2021-08-27 MED ORDER — ACETAMINOPHEN 500 MG PO TABS
1000.0000 mg | ORAL_TABLET | Freq: Three times a day (TID) | ORAL | Status: DC
  Administered 2021-08-27 – 2021-08-30 (×6): 1000 mg via ORAL
  Filled 2021-08-27 (×6): qty 2

## 2021-08-27 MED ORDER — SUGAMMADEX SODIUM 200 MG/2ML IV SOLN
INTRAVENOUS | Status: DC | PRN
  Administered 2021-08-27: 200 mg via INTRAVENOUS

## 2021-08-27 MED ORDER — ORAL CARE MOUTH RINSE: 15.0000 mL | Freq: Once | OROMUCOSAL | Status: AC

## 2021-08-27 MED ORDER — PHENYLEPHRINE HCL (PRESSORS) 10 MG/ML IV SOLN
INTRAVENOUS | Status: DC | PRN
  Administered 2021-08-27: 100 ug via INTRAVENOUS
  Administered 2021-08-27: 80 ug via INTRAVENOUS

## 2021-08-27 MED ORDER — LACTATED RINGERS IV BOLUS
1000.0000 mL | Freq: Once | INTRAVENOUS | Status: AC
  Administered 2021-08-27: 1000 mL via INTRAVENOUS

## 2021-08-27 MED ORDER — HEPARIN SODIUM (PORCINE) 5000 UNIT/ML IJ SOLN
5000.0000 [IU] | INTRAMUSCULAR | Status: AC
  Administered 2021-08-27: 5000 [IU] via SUBCUTANEOUS
  Filled 2021-08-27: qty 1

## 2021-08-27 MED ORDER — STERILE WATER FOR IRRIGATION IR SOLN
Status: DC | PRN
  Administered 2021-08-27: 1000 mL

## 2021-08-27 MED ORDER — ONDANSETRON HCL 4 MG/2ML IJ SOLN
INTRAMUSCULAR | Status: DC | PRN
Start: 2021-08-27 — End: 2021-08-27
  Administered 2021-08-27: 4 mg via INTRAVENOUS

## 2021-08-27 MED ORDER — FENTANYL CITRATE (PF) 100 MCG/2ML IJ SOLN
INTRAMUSCULAR | Status: DC | PRN
  Administered 2021-08-27 (×2): 50 ug via INTRAVENOUS

## 2021-08-27 MED ORDER — FENTANYL CITRATE (PF) 250 MCG/5ML IJ SOLN
INTRAMUSCULAR | Status: AC
  Filled 2021-08-27: qty 5

## 2021-08-27 MED ORDER — CHLORHEXIDINE GLUCONATE 0.12 % MT SOLN
15.0000 mL | Freq: Once | OROMUCOSAL | Status: AC
  Administered 2021-08-27: 15 mL via OROMUCOSAL

## 2021-08-27 MED ORDER — HYDROMORPHONE HCL 1 MG/ML IJ SOLN
0.5000 mg | INTRAMUSCULAR | Status: DC | PRN
  Administered 2021-08-28: 0.5 mg via INTRAVENOUS
  Filled 2021-08-27: qty 0.5

## 2021-08-27 MED ORDER — SODIUM CHLORIDE 0.9 % IV SOLN: INTRAVENOUS | Status: DC

## 2021-08-27 MED ORDER — DEXAMETHASONE SODIUM PHOSPHATE 10 MG/ML IJ SOLN
INTRAMUSCULAR | Status: AC
  Filled 2021-08-27: qty 1

## 2021-08-27 MED ORDER — ONDANSETRON HCL 4 MG/2ML IJ SOLN
INTRAMUSCULAR | Status: AC
  Filled 2021-08-27: qty 2

## 2021-08-27 MED ORDER — METOPROLOL TARTRATE 5 MG/5ML IV SOLN: 5.0000 mg | Freq: Four times a day (QID) | INTRAVENOUS | Status: DC | PRN

## 2021-08-27 MED ORDER — MIDAZOLAM HCL 2 MG/2ML IJ SOLN
INTRAMUSCULAR | Status: AC
  Filled 2021-08-27: qty 2

## 2021-08-27 MED ORDER — BUPIVACAINE LIPOSOME 1.3 % IJ SUSP
20.0000 mL | Freq: Once | INTRAMUSCULAR | Status: DC
Start: 2021-08-27 — End: 2021-08-27

## 2021-08-27 MED ORDER — MIDAZOLAM HCL 5 MG/5ML IJ SOLN
INTRAMUSCULAR | Status: DC | PRN
  Administered 2021-08-27 (×2): 1 mg via INTRAVENOUS

## 2021-08-27 MED ORDER — DEXAMETHASONE SODIUM PHOSPHATE 10 MG/ML IJ SOLN
INTRAMUSCULAR | Status: DC | PRN
  Administered 2021-08-27: 10 mg via INTRAVENOUS

## 2021-08-27 MED ORDER — LIDOCAINE HCL (CARDIAC) PF 100 MG/5ML IV SOSY
PREFILLED_SYRINGE | INTRAVENOUS | Status: DC | PRN
  Administered 2021-08-27: 50 mg via INTRAVENOUS

## 2021-08-27 MED ORDER — LACTATED RINGERS IR SOLN
Status: DC | PRN
  Administered 2021-08-27: 1000 mL

## 2021-08-27 MED ORDER — DIPHENHYDRAMINE HCL 50 MG/ML IJ SOLN
25.0000 mg | Freq: Four times a day (QID) | INTRAMUSCULAR | Status: DC | PRN
  Administered 2021-08-29: 25 mg via INTRAVENOUS
  Filled 2021-08-27: qty 1

## 2021-08-27 MED ORDER — GABAPENTIN 100 MG PO CAPS: 200.0000 mg | ORAL_CAPSULE | Freq: Two times a day (BID) | ORAL | Status: DC

## 2021-08-27 MED ORDER — LIDOCAINE HCL (PF) 2 % IJ SOLN
INTRAMUSCULAR | Status: AC
  Filled 2021-08-27: qty 5

## 2021-08-27 MED ORDER — FENTANYL CITRATE PF 50 MCG/ML IJ SOSY: 25.0000 ug | PREFILLED_SYRINGE | INTRAMUSCULAR | Status: DC | PRN

## 2021-08-27 MED ORDER — BUPIVACAINE-EPINEPHRINE (PF) 0.25% -1:200000 IJ SOLN
INTRAMUSCULAR | Status: DC | PRN
  Administered 2021-08-27: 30 mL

## 2021-08-27 MED ORDER — TRAMADOL HCL 50 MG PO TABS
50.0000 mg | ORAL_TABLET | Freq: Four times a day (QID) | ORAL | Status: DC | PRN
  Administered 2021-08-27: 50 mg via ORAL
  Filled 2021-08-27: qty 1

## 2021-08-27 MED ORDER — DOCUSATE SODIUM 100 MG PO CAPS
100.0000 mg | ORAL_CAPSULE | Freq: Two times a day (BID) | ORAL | Status: DC
  Administered 2021-08-28 – 2021-08-29 (×4): 100 mg via ORAL
  Filled 2021-08-27 (×5): qty 1

## 2021-08-27 MED ORDER — CHLORHEXIDINE GLUCONATE 4 % EX LIQD: 60.0000 mL | Freq: Once | CUTANEOUS | Status: DC

## 2021-08-27 MED ORDER — ROCURONIUM BROMIDE 10 MG/ML (PF) SYRINGE
PREFILLED_SYRINGE | INTRAVENOUS | Status: AC
  Filled 2021-08-27: qty 10

## 2021-08-27 MED ORDER — EPHEDRINE SULFATE (PRESSORS) 50 MG/ML IJ SOLN
INTRAMUSCULAR | Status: DC | PRN
Start: 2021-08-27 — End: 2021-08-27
  Administered 2021-08-27 (×2): 5 mg via INTRAVENOUS

## 2021-08-27 MED ORDER — ONDANSETRON HCL 4 MG/2ML IJ SOLN
4.0000 mg | INTRAMUSCULAR | Status: DC | PRN
  Filled 2021-08-27: qty 2

## 2021-08-27 MED ORDER — BUPROPION HCL ER (XL) 300 MG PO TB24
300.0000 mg | ORAL_TABLET | Freq: Every day | ORAL | Status: DC
Start: 2021-08-28 — End: 2021-08-30
  Administered 2021-08-28 – 2021-08-29 (×2): 300 mg via ORAL
  Filled 2021-08-27 (×2): qty 1

## 2021-08-27 MED ORDER — PREGABALIN 100 MG PO CAPS
300.0000 mg | ORAL_CAPSULE | Freq: Every day | ORAL | Status: DC
Start: 2021-08-28 — End: 2021-08-30
  Administered 2021-08-28 – 2021-08-29 (×2): 300 mg via ORAL
  Filled 2021-08-27 (×2): qty 3

## 2021-08-27 MED ORDER — ACETAMINOPHEN 160 MG/5ML PO SOLN: 1000.0000 mg | Freq: Three times a day (TID) | ORAL | Status: DC

## 2021-08-27 MED ORDER — LACTATED RINGERS IV SOLN: INTRAVENOUS | Status: DC

## 2021-08-27 MED ORDER — ONDANSETRON HCL 4 MG/2ML IJ SOLN: 4.0000 mg | Freq: Once | INTRAMUSCULAR | Status: DC | PRN

## 2021-08-27 MED ORDER — PROPOFOL 10 MG/ML IV BOLUS
INTRAVENOUS | Status: AC
Start: 2021-08-27 — End: ?
  Filled 2021-08-27: qty 20

## 2021-08-27 MED ORDER — METHYLPHENIDATE HCL ER (OSM) 36 MG PO TBCR
36.0000 mg | EXTENDED_RELEASE_TABLET | Freq: Every morning | ORAL | Status: DC
Start: 2021-08-28 — End: 2021-08-30

## 2021-08-27 MED ORDER — APREPITANT 40 MG PO CAPS
40.0000 mg | ORAL_CAPSULE | ORAL | Status: AC
  Administered 2021-08-27: 40 mg via ORAL
  Filled 2021-08-27: qty 1

## 2021-08-27 MED ORDER — ROCURONIUM BROMIDE 100 MG/10ML IV SOLN
INTRAVENOUS | Status: DC | PRN
  Administered 2021-08-27: 80 mg via INTRAVENOUS
  Administered 2021-08-27: 20 mg via INTRAVENOUS

## 2021-08-27 MED ORDER — SCOPOLAMINE 1 MG/3DAYS TD PT72
1.0000 | MEDICATED_PATCH | TRANSDERMAL | Status: AC
  Administered 2021-08-27: 1.5 mg via TRANSDERMAL
  Filled 2021-08-27: qty 1

## 2021-08-27 MED ORDER — PANTOPRAZOLE SODIUM 40 MG IV SOLR
40.0000 mg | Freq: Every day | INTRAVENOUS | Status: DC
  Administered 2021-08-27 – 2021-08-29 (×3): 40 mg via INTRAVENOUS
  Filled 2021-08-27 (×3): qty 10

## 2021-08-27 MED ORDER — ENOXAPARIN SODIUM 30 MG/0.3ML IJ SOSY
30.0000 mg | PREFILLED_SYRINGE | Freq: Two times a day (BID) | INTRAMUSCULAR | Status: DC
  Administered 2021-08-27: 30 mg via SUBCUTANEOUS
  Filled 2021-08-27: qty 0.3

## 2021-08-27 MED ORDER — SIMETHICONE 80 MG PO CHEW
80.0000 mg | CHEWABLE_TABLET | Freq: Four times a day (QID) | ORAL | Status: DC | PRN
  Administered 2021-08-27 – 2021-08-29 (×6): 80 mg via ORAL
  Filled 2021-08-27 (×7): qty 1

## 2021-08-27 MED ORDER — BUPIVACAINE LIPOSOME 1.3 % IJ SUSP
INTRAMUSCULAR | Status: AC
  Filled 2021-08-27: qty 20

## 2021-08-27 MED ORDER — BUPIVACAINE-EPINEPHRINE (PF) 0.25% -1:200000 IJ SOLN
INTRAMUSCULAR | Status: AC
  Filled 2021-08-27: qty 30

## 2021-08-27 MED ORDER — BUPIVACAINE LIPOSOME 1.3 % IJ SUSP
INTRAMUSCULAR | Status: DC | PRN
Start: 2021-08-27 — End: 2021-08-27
  Administered 2021-08-27: 20 mL

## 2021-08-27 MED ORDER — 0.9 % SODIUM CHLORIDE (POUR BTL) OPTIME
TOPICAL | Status: DC | PRN
  Administered 2021-08-27: 1000 mL

## 2021-08-27 MED ORDER — ENSURE MAX PROTEIN PO LIQD
2.0000 [oz_av] | ORAL | Status: DC
  Administered 2021-08-28 – 2021-08-30 (×14): 2 [oz_av] via ORAL

## 2021-08-27 MED ORDER — AMISULPRIDE (ANTIEMETIC) 5 MG/2ML IV SOLN: 10.0000 mg | Freq: Once | INTRAVENOUS | Status: DC | PRN

## 2021-08-27 MED ORDER — HYDRALAZINE HCL 20 MG/ML IJ SOLN: 10.0000 mg | INTRAMUSCULAR | Status: DC | PRN

## 2021-08-27 MED ORDER — ACETAMINOPHEN 500 MG PO TABS
1000.0000 mg | ORAL_TABLET | ORAL | Status: AC
  Administered 2021-08-27: 1000 mg via ORAL
  Filled 2021-08-27: qty 2

## 2021-08-27 SURGICAL SUPPLY — 78 items
APL PRP STRL LF DISP 70% ISPRP (MISCELLANEOUS) ×2
APL SKNCLS STERI-STRIP NONHPOA (GAUZE/BANDAGES/DRESSINGS) ×1
APPLIER CLIP ROT 10 11.4 M/L (STAPLE)
APPLIER CLIP ROT 13.4 12 LRG (CLIP) ×2
APR CLP LRG 13.4X12 ROT 20 MLT (CLIP) ×1
APR CLP MED LRG 11.4X10 (STAPLE)
BAG COUNTER SPONGE SURGICOUNT (BAG) IMPLANT
BAG LAPAROSCOPIC 12 15 PORT 16 (BASKET) IMPLANT
BAG RETRIEVAL 12/15 (BASKET) ×2
BAG SPNG CNTER NS LX DISP (BAG)
BENZOIN TINCTURE PRP APPL 2/3 (GAUZE/BANDAGES/DRESSINGS) ×2 IMPLANT
BLADE SURG SZ11 CARB STEEL (BLADE) ×2 IMPLANT
BNDG ADH 1X3 SHEER STRL LF (GAUZE/BANDAGES/DRESSINGS) ×12 IMPLANT
BNDG ADH THN 3X1 STRL LF (GAUZE/BANDAGES/DRESSINGS) ×6
CABLE HIGH FREQUENCY MONO STRZ (ELECTRODE) IMPLANT
CHLORAPREP W/TINT 26 (MISCELLANEOUS) ×4 IMPLANT
CLIP APPLIE ROT 10 11.4 M/L (STAPLE) IMPLANT
CLIP APPLIE ROT 13.4 12 LRG (CLIP) IMPLANT
COVER SURGICAL LIGHT HANDLE (MISCELLANEOUS) ×2 IMPLANT
DEVICE SUT QUICK LOAD TK 5 (STAPLE) ×1 IMPLANT
DEVICE SUT TI-KNOT TK 5X26 (MISCELLANEOUS) ×1 IMPLANT
DEVICE SUTURE ENDOST 10MM (ENDOMECHANICALS) ×1 IMPLANT
DRAPE UTILITY XL STRL (DRAPES) ×4 IMPLANT
ELECT REM PT RETURN 15FT ADLT (MISCELLANEOUS) ×2 IMPLANT
GAUZE SPONGE 4X4 12PLY STRL (GAUZE/BANDAGES/DRESSINGS) IMPLANT
GLOVE BIO SURGEON STRL SZ 6 (GLOVE) ×2 IMPLANT
GLOVE INDICATOR 6.5 STRL GRN (GLOVE) ×2 IMPLANT
GLOVE SS BIOGEL STRL SZ 6 (GLOVE) ×1 IMPLANT
GLOVE SUPERSENSE BIOGEL SZ 6 (GLOVE) ×1
GOWN STRL REUS W/ TWL LRG LVL3 (GOWN DISPOSABLE) ×1 IMPLANT
GOWN STRL REUS W/ TWL XL LVL3 (GOWN DISPOSABLE) IMPLANT
GOWN STRL REUS W/TWL LRG LVL3 (GOWN DISPOSABLE) ×2
GOWN STRL REUS W/TWL XL LVL3 (GOWN DISPOSABLE)
GRASPER SUT TROCAR 14GX15 (MISCELLANEOUS) ×2 IMPLANT
IRRIG SUCT STRYKERFLOW 2 WTIP (MISCELLANEOUS) ×2
IRRIGATION SUCT STRKRFLW 2 WTP (MISCELLANEOUS) ×1 IMPLANT
KIT BASIN OR (CUSTOM PROCEDURE TRAY) ×2 IMPLANT
KIT TURNOVER KIT A (KITS) IMPLANT
MARKER SKIN DUAL TIP RULER LAB (MISCELLANEOUS) ×2 IMPLANT
MAT PREVALON FULL STRYKER (MISCELLANEOUS) ×2 IMPLANT
NDL SPNL 22GX3.5 QUINCKE BK (NEEDLE) ×1 IMPLANT
NEEDLE SPNL 22GX3.5 QUINCKE BK (NEEDLE) ×2 IMPLANT
PACK UNIVERSAL I (CUSTOM PROCEDURE TRAY) ×2 IMPLANT
RELOAD ENDO STITCH (ENDOMECHANICALS) IMPLANT
RELOAD STAPLE 60 3.6 BLU REG (STAPLE) ×1 IMPLANT
RELOAD STAPLE 60 3.8 GOLD REG (STAPLE) ×1 IMPLANT
RELOAD STAPLE 60 4.1 GRN THCK (STAPLE) IMPLANT
RELOAD STAPLER BLUE 60MM (STAPLE) ×5 IMPLANT
RELOAD STAPLER GOLD 60MM (STAPLE) ×1 IMPLANT
RELOAD STAPLER GREEN 60MM (STAPLE) ×1 IMPLANT
RELOAD SUT TRIPLE-STITCH 2-0 (ENDOMECHANICALS) IMPLANT
SCISSORS LAP 5X45 EPIX DISP (ENDOMECHANICALS) ×2 IMPLANT
SET TUBE SMOKE EVAC HIGH FLOW (TUBING) ×2 IMPLANT
SHEARS HARMONIC ACE PLUS 45CM (MISCELLANEOUS) ×2 IMPLANT
SLEEVE ADV FIXATION 5X100MM (TROCAR) ×4 IMPLANT
SLEEVE GASTRECTOMY 40FR VISIGI (MISCELLANEOUS) ×2 IMPLANT
SOL ANTI FOG 6CC (MISCELLANEOUS) ×1 IMPLANT
SOLUTION ANTI FOG 6CC (MISCELLANEOUS) ×1
SPIKE FLUID TRANSFER (MISCELLANEOUS) ×2 IMPLANT
STAPLE LINE REINFORCEMENT LAP (STAPLE) ×6 IMPLANT
STAPLER ECHELON LONG 60 440 (INSTRUMENTS) ×2 IMPLANT
STAPLER RELOAD BLUE 60MM (STAPLE) ×10
STAPLER RELOAD GOLD 60MM (STAPLE) ×2
STAPLER RELOAD GREEN 60MM (STAPLE) ×2
STRIP CLOSURE SKIN 1/2X4 (GAUZE/BANDAGES/DRESSINGS) ×2 IMPLANT
SUT MNCRL AB 4-0 PS2 18 (SUTURE) ×2 IMPLANT
SUT SURGIDAC NAB ES-9 0 48 120 (SUTURE) ×1 IMPLANT
SUT VICRYL 0 TIES 12 18 (SUTURE) ×2 IMPLANT
SYR 10ML ECCENTRIC (SYRINGE) ×2 IMPLANT
SYR 20ML LL LF (SYRINGE) ×2 IMPLANT
SYR 50ML LL SCALE MARK (SYRINGE) ×2 IMPLANT
TOWEL OR 17X26 10 PK STRL BLUE (TOWEL DISPOSABLE) ×2 IMPLANT
TOWEL OR NON WOVEN STRL DISP B (DISPOSABLE) ×2 IMPLANT
TROCAR ADV FIXATION 5X100MM (TROCAR) ×2 IMPLANT
TROCAR BLADELESS 15MM (ENDOMECHANICALS) ×2 IMPLANT
TROCAR BLADELESS OPT 5 100 (ENDOMECHANICALS) ×2 IMPLANT
TUBING CONNECTING 10 (TUBING) ×2 IMPLANT
TUBING ENDO SMARTCAP (MISCELLANEOUS) ×2 IMPLANT

## 2021-08-27 NOTE — Transfer of Care (Signed)
Immediate Anesthesia Transfer of Care Note ? ?Patient: Angela Arroyo ? ?Procedure(s) Performed: LAPAROSCOPIC SLEEVE GASTRECTOMY ?UPPER GI ENDOSCOPY ?HERNIA REPAIR HIATAL ? ?Patient Location: PACU ? ?Anesthesia Type:General ? ?Level of Consciousness: awake, alert , oriented and patient cooperative ? ?Airway & Oxygen Therapy: Patient Spontanous Breathing and Patient connected to nasal cannula oxygen ? ?Post-op Assessment: Report given to RN and Post -op Vital signs reviewed and stable ? ?Post vital signs: Reviewed and stable ? ?Last Vitals:  ?Vitals Value Taken Time  ?BP 143/93 08/27/21 1311  ?Temp    ?Pulse 94 08/27/21 1314  ?Resp 13 08/27/21 1314  ?SpO2 100 % 08/27/21 1314  ?Vitals shown include unvalidated device data. ? ?Last Pain:  ?Vitals:  ? 08/27/21 0850  ?TempSrc:   ?PainSc: 0-No pain  ?   ? ?  ? ?Complications: No notable events documented. ?

## 2021-08-27 NOTE — Op Note (Signed)
Operative Note ? ?Angela Arroyo  ?[id number]  ?[id number]  ?08/27/2021 ? ? ?Surgeon: Phylliss Blakes MD FACS ?  ?Assistant: Wenda Low MD FACS ?  ?Procedure performed: laparoscopic sleeve gastrectomy, hiatal hernia repair, upper endoscopy ?  ?Preop diagnosis: Morbid obesity Body mass index is 37.74 kg/m?Marland Kitchen ?Post-op diagnosis/intraop findings: same ?  ?Specimens: fundus ?Retained items: none  ?EBL: minimal  ?Complications: none ?  ?Description of procedure: After obtaining informed consent and administration of chemical DVT prophylaxis in holding, the patient was taken to the operating room and placed supine on operating room table where general endotracheal anesthesia was initiated, preoperative antibiotics were administered, SCDs applied, and a formal timeout was performed. The abdomen was prepped and draped in usual sterile fashion. Peritoneal access was gained using a Visiport technique in the left upper quadrant and insufflation to 15 mmHg ensued without issue. Gross inspection revealed no evidence of injury.  Under direct visualization three more 5 mm trochars were placed in the right and left hemiabdomen and the 34mm trocar in the right paramedian upper abdomen. Bilateral laparoscopic assisted TAPS blocks were performed with Exparel diluted with 0.25 percent Marcaine with epinephrine. The patient was placed in steep Trendelenburg and the liver retractor was introduced through an incision in the upper midline and secured to the post externally to maintain the left lobe retracted anteriorly.  ?There was a sliding hiatal hernia confirmed on direct inspection with incarcerated preperitoneal fat.  The crura were dissected circumferentially using the harmonic scalpel and blunt dissection, reducing a large fat pad posterior to the esophagus and exposing the crural decussation.  The hiatus was narrowed to the appropriate diam with a single 0 Ethibond secured with a ty-knot.  ?Using the Harmonic scalpel, the greater  curvature of the stomach was dissected away from the greater omentum and short gastric vessels were divided. This began 6 cm from the pylorus, and dissection proceeded until the left crus was clearly exposed. There were some filmy adhesions of the posterior stomach to the retroperitoneum which were divided with the Harmonic. The 25 [place] VisiGi was then introduced and directed down towards the pylorus. This was placed to suction against the lesser curve. Serial fires of the linear cutting stapler with staple line reinforcement were then employed to create our sleeve. The first fire used a green load and ensured adequate room at the angularis incisura. One gold load and then several blue loads were then employed to create a narrow tubular stomach preserving 1 cm lateral to angle of His. The excised stomach was then removed through our 15 mm trocar site within an Endo Catch bag.  ?The visigi was taken off of suction and a few puffs of air were introduced, inflating the sleeve. No bubbles were observed in the irrigation fluid around the stomach and the shape was noted to be evenly tubular without any narrowing at the angularis. The visigi was then removed. Upper endoscopy was performed by the assistant surgeon and the sleeve was noted to be airtight, the staple line was hemostatic.  The lumen appeared evenly tubular with no undue narrowing or angulation specifically at the incisura.  Please see his separate note. The endoscope was removed. A small amount of oozing on the distal staple line was addressed with clips. The 15 mm trocar site fascia in the right upper abdomen was closed with a 0 Vicryl using the laparoscopic suture passer under direct visualization. The liver retractor was removed under direct visualization. The abdomen was then desufflated and all remaining trochars  removed. The skin incisions were closed with subcuticular Monocryl; benzoin, Steri-Strips and Band-Aids were applied The patient was then  awakened, extubated and taken to PACU in stable condition.   ?  ?All counts were correct at the completion of the case.  ?   ?

## 2021-08-27 NOTE — Anesthesia Preprocedure Evaluation (Addendum)
Anesthesia Evaluation  ?Patient identified by MRN, date of birth, ID band ?Patient awake ? ? ? ?Reviewed: ?Allergy & Precautions, NPO status , Patient's Chart, lab work & pertinent test results ? ?Airway ?Mallampati: II ? ?TM Distance: >3 FB ?Neck ROM: Full ? ? ?Comment: Preserved neck extension Dental ?no notable dental hx. ? ?  ?Pulmonary ?neg pulmonary ROS, former smoker,  ?  ?Pulmonary exam normal ?breath sounds clear to auscultation ? ? ? ? ? ? Cardiovascular ?hypertension, Normal cardiovascular exam ?Rhythm:Regular Rate:Normal ? ?Normal sinus rhythm ?Cannot rule out Anterior infarct , age undetermined ?Abnormal ECG ?When compared with ECG of 01-May-2014 08:31, ?PREVIOUS ECG IS PRESENT ?  ?Neuro/Psych ? Headaches, PSYCHIATRIC DISORDERS Depression   ? GI/Hepatic ?Neg liver ROS, GERD  Medicated,  ?Endo/Other  ?obesity ? Renal/GU ?negative Renal ROS  ? ?H/o urethral obstruction ? ?  ?Musculoskeletal ? ?(+) Arthritis  (cervical spine surgery x 3), Osteoarthritis,   ? Abdominal ?(+) + obese,   ?Peds ?negative pediatric ROS ?(+)  Hematology ?negative hematology ROS ?(+)   ?Anesthesia Other Findings ? ? Reproductive/Obstetrics ?negative OB ROS ? ?  ? ? ? ? ? ? ? ? ? ? ? ? ? ?  ?  ? ? ? ? ? ? ? ?Anesthesia Physical ?Anesthesia Plan ? ?ASA: 3 ? ?Anesthesia Plan: General  ? ?Post-op Pain Management: Tylenol PO (pre-op)*  ? ?Induction: Intravenous ? ?PONV Risk Score and Plan: 3 and Treatment may vary due to age or medical condition, Scopolamine patch - Pre-op, Midazolam, Ondansetron and Dexamethasone ? ?Airway Management Planned: Oral ETT and Video Laryngoscope Planned ? ?Additional Equipment:  ? ?Intra-op Plan:  ? ?Post-operative Plan: Extubation in OR ? ?Informed Consent: I have reviewed the patients History and Physical, chart, labs and discussed the procedure including the risks, benefits and alternatives for the proposed anesthesia with the patient or authorized representative who  has indicated his/her understanding and acceptance.  ? ? ? ?Dental advisory given ? ?Plan Discussed with: CRNA, Anesthesiologist and Surgeon ? ?Anesthesia Plan Comments:   ? ? ? ? ? ? ?Anesthesia Quick Evaluation ? ?

## 2021-08-27 NOTE — Anesthesia Procedure Notes (Signed)
Procedure Name: Intubation ?Date/Time: 08/27/2021 11:15 AM ?Performed by: Garrel Ridgel, CRNA ?Pre-anesthesia Checklist: Patient identified, Emergency Drugs available, Suction available and Patient being monitored ?Patient Re-evaluated:Patient Re-evaluated prior to induction ?Oxygen Delivery Method: Circle system utilized ?Preoxygenation: Pre-oxygenation with 100% oxygen ?Induction Type: IV induction ?Ventilation: Mask ventilation without difficulty ?Laryngoscope Size: Glidescope ?Grade View: Grade II ?Tube type: Oral ?Number of attempts: 1 ?Airway Equipment and Method: Stylet and Video-laryngoscopy ?Placement Confirmation: ETT inserted through vocal cords under direct vision, positive ETCO2 and breath sounds checked- equal and bilateral ?Secured at: 23 cm ?Tube secured with: Tape ?Dental Injury: Teeth and Oropharynx as per pre-operative assessment  ?Difficulty Due To: Difficulty was anticipated and Difficult Airway- due to reduced neck mobility ? ? ? ? ?

## 2021-08-27 NOTE — Anesthesia Postprocedure Evaluation (Signed)
Anesthesia Post Note ? ?Patient: [name] ? ?Procedure(s) Performed: LAPAROSCOPIC SLEEVE GASTRECTOMY ?UPPER GI ENDOSCOPY ?HERNIA REPAIR HIATAL ? ?  ? ?Patient location during evaluation: PACU ?Anesthesia Type: General ?Level of consciousness: awake ?Pain management: pain level controlled ?Vital Signs Assessment: post-procedure vital signs reviewed and stable ?Respiratory status: spontaneous breathing and respiratory function stable ?Cardiovascular status: stable ?Postop Assessment: no apparent nausea or vomiting ?Anesthetic complications: no ? ? ?No notable events documented. ? ?Last Vitals:  ?Vitals:  ? 08/27/21 1414 08/27/21 1515  ?BP: (!) 173/108 (!) 158/105  ?Pulse: 87 72  ?Resp: 15 15  ?Temp: 36.5 ?C 36.6 ?C  ?SpO2: 95% 93%  ?  ?Last Pain:  ?Vitals:  ? 08/27/21 1515  ?TempSrc: Oral  ?PainSc:   ? ? ?  ?  ?  ?  ?  ?  ? ?Angela Arroyo ? ? ? ? ?

## 2021-08-27 NOTE — Discharge Instructions (Signed)
GASTRIC BYPASS / SLEEVE  ?Home Care Instructions ? ?These instructions are to help you care for yourself when you go home. ? ?Call: If you have any problems. ?Call [phone] and ask for the surgeon on call ?If you have an emergency related to your surgery please use the ER at [facility].  ?Tell the ER staff that you are a new post-op gastric bypass or gastric sleeve patient ?  ?Signs and symptoms to report: Severe vomiting or nausea ?If you cannot handle clear liquids for longer than 1 day, call your surgeon  ?Abdominal pain which does not get better after taking your pain medication ?Fever greater than 100.4? F and chills ?Heart rate over 100 beats a minute ?Trouble breathing ?Chest pain ? Redness, swelling, drainage, or foul odor at incision (surgical) sites ? If your incisions open or pull apart ?Swelling or pain in calf (lower leg) ?Diarrhea (Loose bowel movements that happen often), frequent watery, uncontrolled bowel movements ?Constipation, (no bowel movements for 3 days) if this happens:  ?Take Milk of Magnesia, 2 tablespoons by mouth, 3 times a day for 2 days if needed ?Stop taking Milk of Magnesia once you have had a bowel movement ?Call your doctor if constipation continues ?Or ?Take Miralax  (instead of Milk of Magnesia) following the label instructions ?Stop taking Miralax once you have had a bowel movement ?Call your doctor if constipation continues ?Anything you think is ?abnormal for you? ?  ?Normal side effects after surgery: Unable to sleep at night or unable to concentrate ?Irritability ?Being tearful (crying) or depressed ?These are common complaints, possibly related to your anesthesia, stress of surgery and change in lifestyle, that usually go away a few weeks after surgery.  If these feelings continue, call your medical doctor.  ?Wound Care: You may have surgical glue, steri-strips, or staples over your incisions after surgery ?Surgical glue:  Looks like a clear film over your incisions  and will wear off a little at a time ?Steri-strips : Adhesive strips of tape over your incisions. You may notice a yellowish color on the skin under the steri-strips. This is used to make the   steri-strips stick better. Do not pull the steri-strips off - let them fall off ?Staples: Staples may be removed before you leave the hospital ?If you go home with staples, call [facility] at for an appointment with your surgeon?s nurse to have staples removed 10 days after surgery, [phone] ?Showering: You may shower two (2) days after your surgery unless your surgeon tells you differently ?Wash gently around incisions with warm soapy water, rinse well, and gently pat dry  ?If you have a drain (tube from your incision), you may need someone to hold this while you shower  ?No tub baths until staples are removed and incisions are healed   ?  ?Medications: Medications should be liquid or crushed if larger than the size of a dime ?Extended release pills (medication that releases a little bit at a time through the day) should not be crushed ?Depending on the size and number of medications you take, you may need to space (take a few throughout the day)/change the time you take your medications so that you do not over-fill your pouch (smaller stomach) ?Make sure you follow-up with your primary care physician to make medication changes needed during rapid weight loss and life-style changes ?If you have diabetes, follow up with the doctor that orders your diabetes medication(s) within one week after surgery and check   your blood sugar regularly. ?Do not drive while taking narcotics (pain medications) ?DO NOT take NSAID'S (Examples of NSAID's include ibuprofen, naproxen)  ?Diet:                    First 2 Weeks ? You will see the nutritionist about two (2) weeks after your surgery. The nutritionist will increase the types of foods you can eat if you are handling liquids well: ?If you have severe vomiting or nausea  and cannot handle clear liquids lasting longer than 1 day, call your surgeon  ?Protein Shake ?Drink at least 2 ounces of shake 5-6 times per day ?Each serving of protein shakes (usually 8 - 12 ounces) should have a minimum of:  ?15 grams of protein  ?And no more than 5 grams of carbohydrate  ?Goal for protein each day: ?Men = 80 grams per day ?Women = 60 grams per day ?Protein powder may be added to fluids such as non-fat milk or Lactaid milk or Soy milk (limit to 35 grams added protein powder per serving) ? ?Hydration ?Slowly increase the amount of water and other clear liquids as tolerated (See Acceptable Fluids) ?Slowly increase the amount of protein shake as tolerated  ? Sip fluids slowly and throughout the day ?May use sugar substitutes in small amounts (no more than 6 - 8 packets per day; i.e. Splenda) ? ?Fluid Goal ?The first goal is to drink at least 8 ounces of protein shake/drink per day (or as directed by the nutritionist);  See handout from pre-op Bariatric Education Class for examples of protein shake/drink.   ?Slowly increase the amount of protein shake you drink as tolerated ?You may find it easier to slowly sip shakes throughout the day ?It is important to get your proteins in first ?Your fluid goal is to drink 64 - 100 ounces of fluid daily ?It may take a few weeks to build up to this ?32 oz (or more) should be clear liquids  ?And  ?32 oz (or more) should be full liquids (see below for examples) ?Liquids should not contain sugar, caffeine, or carbonation ? ?Clear Liquids: ?Water or Sugar-free flavored water (i.e. Fruit H2O, Propel) ?Decaffeinated coffee or tea (sugar-free) ?Tallin Hart Lite, Wyler?s Lite, Minute Maid Lite ?Sugar-free Jell-O ?Bouillon or broth ?Sugar-free Popsicle:   *Less than 20 calories each; Limit 1 per day ? ?Full Liquids: ?Protein Shakes/Drinks + 2 choices per day of other full liquids ?Full liquids must be: ?No More Than 12 grams of Carbs per serving  ?No More Than 3 grams of Fat  per serving ?Strained low-fat cream soup ?Non-Fat milk ?Fat-free Lactaid Milk ?Sugar-free yogurt (Dannon Lite & Fit, Greek yogurt) ? ? ? ?  ?Vitamins and Minerals Start 1 day after surgery unless otherwise directed by your surgeon ?Bariatric Specific Complete Multivitamins ?Chewable Calcium Citrate with Vitamin D-3 ?(Example: 3 Chewable Calcium Plus 600 with Vitamin D-3) ?Take 500 mg three (3) times a day for a total of 1500 mg each day ?Do not take all 3 doses of calcium at one time as it may cause constipation, and you can only absorb 500 mg  at a time  ?Do not mix multivitamins containing iron with calcium supplements; take 2 hours apart ? ?Menstruating women and those at risk for anemia (a blood disease that causes weakness) may need extra iron ?Talk with your doctor to see if you need more iron ?If you need extra iron: Total daily Iron recommendation (including Vitamins) is 50 to 100   mg Iron/day ?Do not stop taking or change any vitamins or minerals until you talk to your nutritionist or surgeon ?Your nutritionist and/or surgeon must approve all vitamin and mineral supplements ?  ?Activity and Exercise: It is important to continue walking at home.  Limit your physical activity as instructed by your doctor.  During this time, use these guidelines: ?Do not lift anything greater than ten (10) pounds for at least two (2) weeks ?Do not go back to work or drive until your surgeon says you can ?You may have sex when you feel comfortable  ?It is VERY important for female patients to use a reliable birth control method; fertility often increases after surgery  ?Do not get pregnant for at least 18 months ?Start exercising as soon as your doctor tells you that you can ?Make sure your doctor approves any physical activity ?Start with a simple walking program ?Walk 5-15 minutes each day, 7 days per week.  ?Slowly increase until you are walking 30-45 minutes per day ?Consider joining our BELT program. [phone] or email  [email or web address] ?  ?Special Instructions Things to remember: ? ?Use your CPAP when sleeping if this applies to you, do not stop the use of CPAP unless directed by physician after a sleep study ?[facility]

## 2021-08-27 NOTE — Op Note (Signed)
Angela Arroyo ?[id number] ?December 18, 1965 ?08/27/2021 ? ?Preoperative diagnosis: sleeve gastrectomy in progress ? ?Postoperative diagnosis: Same  ? ?Procedure: Upper endoscopy  ? ?Surgeon: Pollyann Savoy. Daphine Deutscher  M.D., FACS  ? ?Anesthesia: Gen.  ? ?Indications for procedure: This patient was undergoing a sleeve gastrectomy.  ? ? ?Description of procedure: The endoscopy was placed in the mouth and into the oropharynx and under endoscopic vision it was advanced to the esophagogastric junction.  The sleeve was insufflated and it appeared symmetrical and cylindrical.   ?No bleeding or leaks were detected.  The scope was withdrawn without difficulty.   ? ? ?Matt B. Daphine Deutscher, MD, FACS ?General, Bariatric, & Minimally Invasive Surgery ?[facility], [place] ?  ?

## 2021-08-27 NOTE — Progress Notes (Signed)
?  Transition of Care (TOC) Screening Note ? ? ?Patient Details  ?Name: Angela Arroyo ?Date of Birth: 1965-08-05 ? ? ?Transition of Care (TOC) CM/SW Contact:    ?Mersadies Petree, LCSW ?Phone Number: ?08/27/2021, 2:52 PM ? ? ? ?Transition of Care Department [facility]) has reviewed patient and no TOC needs have been identified at this time. We will continue to monitor patient advancement through interdisciplinary progression rounds. If new patient transition needs arise, please place a TOC consult. ? ? ?

## 2021-08-27 NOTE — Progress Notes (Signed)
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
## 2021-08-27 NOTE — H&P (Signed)
?Angela Arroyo ?[id number]  ? ?Referring Provider:  Self ? ? ?Subjective  ? ?Chief Complaint: Weight Loss  (Pre - op) ?  ? ? ?History of Present Illness: ?Follow-up today for further discussion regarding her decision regarding surgical treatment of morbid obesity.  She has completed the pathway with no barriers identified.  She has a few insightful questions to discuss today. ? ?UGI/ CXR: small intermittent type I hiatal hernia, otherwise normal ?Labs-creatinine 1.3, hemoglobin A1c 5.8, cholesterol and triglycerides elevated, ?Dietician- cleared ? 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 ?She was initially considering abdominoplasty but was told that her BMI was too high to proceed with this.  She has considered bariatric surgery in the past.  She has tried multiple diets and medications, most recently Saxenda, without significant success. ?She endorses a fairly well-balanced eating style.  Denies any specific issues with emotional eating, binge eating, etc. Reports her activity level has been increased lately but no specific exercise regimen. ?She does endorse reflux.  This is somewhat controlled on Dexilant.  She has history of requiring esophageal dilation, last EGD was 5 or 6 years ago. ?Previous abdominal surgeries include cholecystectomy, ovarian cyst removal, tubal ligation and subsequent reversal, vaginal hysterectomy, urinary sling with subsequent revision, tummy tuck ? ?Lives at home with her husband.  She often watches her grandkids, age ranges 2-6, and has a new miniature burnadoodle puppy at home that is keeping her very active. ? ?Review of Systems: ?A complete review of systems  was obtained from the patient.  I have reviewed this information and discussed as appropriate with the patient.  See HPI as well for other ROS. ? ? ?Medical History: ?Past Medical History:  ?Diagnosis Date  ? Arthritis   ? Chronic kidney disease   ? GERD (gastroesophageal reflux disease)   ? Hyperlipidemia   ? Hypertension   ? Sleep apnea   ? ? ?There is no problem list on file for this patient. ? ? ?Past Surgical History:  ?Procedure Laterality Date  ? gallbladder surgery    ? HYSTERECTOMY    ? mesh removal surgery N/A   ? tubal reversal surgery N/A   ? tummy tuck surgery N/A   ? vaginal mesh removal  N/A   ?  ? ?Allergies  ?Allergen Reactions  ? Oxycodone-Acetaminophen Hives and Itching  ?  Other reaction(s): hives ?  ? Ace Inhibitors Other (See Comments)  ?  cough ?cough ?Other reaction(s): Other (See Comments) ?cough ?cough ?Other reaction(s): Cough (finding) ?  ? Acetaminophen Unknown  ? Oxycodone (Bulk) Unknown  ? ? ?Current Outpatient Medications on File Prior to Visit  ?Medication Sig Dispense Refill  ? buPROPion (WELLBUTRIN XL) 300 MG XL tablet     ? dextroamphetamine-amphetamine (ADDERALL XR) 25 MG XR capsule Take by mouth once daily    ? HYDROcodone-acetaminophen (NORCO) 5-325 mg tablet Take by mouth    ? potassium chloride (MICRO-K) 10 MEQ ER capsule     ? pregabalin (LYRICA) 150 MG capsule     ? pregabalin (LYRICA) 150 MG capsule Take by mouth    ? tiZANidine (ZANAFLEX) 4 MG tablet 1/2 - 1 tab Q 6hrs prn    ? valACYclovir (VALTREX) 1000 MG tablet     ? ?No current facility-administered   medications on file prior to visit.  ? ? ?Family History  ?Problem Relation Age of Onset  ? Breast cancer Mother   ? Stroke Father   ? Skin cancer Father   ? High blood pressure (Hypertension) Father   ? Obesity Father   ? Hyperlipidemia (Elevated cholesterol) Father   ? Heart valve disease Father   ? Obesity Sister   ? High blood pressure (Hypertension) Sister   ? Hyperlipidemia (Elevated cholesterol) Sister   ? Obesity  Brother   ? High blood pressure (Hypertension) Brother   ? Hyperlipidemia (Elevated cholesterol) Brother   ?  ? ?Social History  ? ?Tobacco Use  ?Smoking Status Former  ? Types: Cigarettes  ?Smokeless Tobacco Never  ?  ? ?Social History  ? ?Socioeconomic History  ? Marital status: Married  ?Tobacco Use  ? Smoking status: Former  ?  Types: Cigarettes  ? Smokeless tobacco: Never  ?Vaping Use  ? Vaping Use: Never used  ?Substance and Sexual Activity  ? Alcohol use: Yes  ? Drug use: Never  ? ? ?Objective:  ? ? ?Vitals:  ? 07/26/21 1044  ?BP: 130/70  ?Pulse: 96  ?Temp: 36.3 ?C (97.4 ?F)  ?SpO2: 98%  ?Weight: (!) 102.2 kg (225 lb 6.4 oz)  ?Height: 165.1 cm (5' 5")  ?  ?Body mass index is 37.51 kg/m?. ? ?Well-appearing ?Unlabored respirations ? ? ?Assessment and Plan:  ?Diagnoses and all orders for this visit: ? ?Morbid obesity (CMS-HCC) ? ?Gastroesophageal reflux disease, unspecified whether esophagitis present ? ?Essential (primary) hypertension ? ?Other irritable bowel syndrome ? ?Hyperlipidemia, unspecified hyperlipidemia type ? ?Recurrent major depressive disorder, remission status unspecified (CMS-HCC) ? ?Attention deficit disorder, unspecified hyperactivity presence ? ?Stage 3 chronic kidney disease, unspecified whether stage 3a or 3b CKD (CMS-HCC) ? ?  ?She has completed the bariatric pathway with no barriers identified.  Her upper GI does show a small hiatal hernia which may be contributing a fair amount to her reflux.  We had a long discussion again today about the merits versus trade-offs of sleeve versus bypass.  She understands well that sleeve does carry some risk of worsening reflux and we also went over the possible downsides of gastric bypass.  I think she has excellent understanding at this point and still wishes to proceed with sleeve which I do think is reasonable.  We will proceed with sleeve gastrectomy hiatal hernia repair. ? ?Angela Arroyo Angela Sueanne Maniaci, MD  ? ? ? ?

## 2021-08-28 ENCOUNTER — Encounter (HOSPITAL_COMMUNITY): Payer: Self-pay | Admitting: Surgery

## 2021-08-28 LAB — CBC WITH DIFFERENTIAL/PLATELET
Abs Immature Granulocytes: 0.06 10*3/uL (ref 0.00–0.07)
Basophils Absolute: 0 10*3/uL (ref 0.0–0.1)
Basophils Relative: 0 %
Eosinophils Absolute: 0 10*3/uL (ref 0.0–0.5)
Eosinophils Relative: 0 %
HCT: 30.4 % — ABNORMAL LOW (ref 36.0–46.0)
Hemoglobin: 10.1 g/dL — ABNORMAL LOW (ref 12.0–15.0)
Immature Granulocytes: 1 %
Lymphocytes Relative: 11 %
Lymphs Abs: 1.3 10*3/uL (ref 0.7–4.0)
MCH: 30.1 pg (ref 26.0–34.0)
MCHC: 33.2 g/dL (ref 30.0–36.0)
MCV: 90.5 fL (ref 80.0–100.0)
Monocytes Absolute: 0.8 10*3/uL (ref 0.1–1.0)
Monocytes Relative: 6 %
Neutro Abs: 9.8 10*3/uL — ABNORMAL HIGH (ref 1.7–7.7)
Neutrophils Relative %: 82 %
Platelets: 337 10*3/uL (ref 150–400)
RBC: 3.36 MIL/uL — ABNORMAL LOW (ref 3.87–5.11)
RDW: 13.2 % (ref 11.5–15.5)
WBC: 11.9 10*3/uL — ABNORMAL HIGH (ref 4.0–10.5)
nRBC: 0 % (ref 0.0–0.2)

## 2021-08-28 LAB — COMPREHENSIVE METABOLIC PANEL
ALT: 72 U/L — ABNORMAL HIGH (ref 0–44)
AST: 85 U/L — ABNORMAL HIGH (ref 15–41)
Albumin: 3.4 g/dL — ABNORMAL LOW (ref 3.5–5.0)
Alkaline Phosphatase: 93 U/L (ref 38–126)
Anion gap: 10 (ref 5–15)
BUN: 18 mg/dL (ref 6–20)
CO2: 24 mmol/L (ref 22–32)
Calcium: 8.8 mg/dL — ABNORMAL LOW (ref 8.9–10.3)
Chloride: 105 mmol/L (ref 98–111)
Creatinine, Ser: 1.49 mg/dL — ABNORMAL HIGH (ref 0.44–1.00)
GFR, Estimated: 41 mL/min — ABNORMAL LOW (ref >60)
Glucose, Bld: 168 mg/dL — ABNORMAL HIGH (ref 70–99)
Potassium: 4.7 mmol/L (ref 3.5–5.1)
Sodium: 139 mmol/L (ref 135–145)
Total Bilirubin: 0.6 mg/dL (ref 0.3–1.2)
Total Protein: 6.5 g/dL (ref 6.5–8.1)

## 2021-08-28 LAB — MAGNESIUM: Magnesium: 1.8 mg/dL (ref 1.7–2.4)

## 2021-08-28 MED ORDER — BISACODYL 10 MG RE SUPP
10.0000 mg | Freq: Once | RECTAL | Status: AC
  Administered 2021-08-28: 10 mg via RECTAL
  Filled 2021-08-28: qty 1

## 2021-08-28 MED ORDER — BISACODYL 10 MG RE SUPP
10.0000 mg | Freq: Once | RECTAL | Status: DC
  Filled 2021-08-28: qty 1

## 2021-08-28 NOTE — Progress Notes (Signed)
Patient alert and oriented, pain is controlled. Patient is tolerating fluids, advanced to protein shake today, patient is tolerating well.  Reviewed Gastric sleeve discharge instructions with patient and patient is able to articulate understanding.  Provided information on BELT program, Support Group and [facility] outpatient pharmacy. All questions answered, will continue to monitor.  

## 2021-08-28 NOTE — Progress Notes (Signed)
S: Somewhat of a rough night.  She had a hypotensive episode when she got up to go to the bathroom.  CBC checked at that time noted with hemoglobin of 10 from 13.3 preop, this is stable this morning.  She did get a 1 L bolus.  She feels a little bit better this morning, but notes that her abdomen is distended.  Complaining of achy pain in her neck and left shoulder.  She hs burped a few times, but not passing any flatus or gas.  Tolerating liquids without any nausea, is having the standard epigastric pain with feeling of the sleeve. ? ?O: ?Vitals, labs, intake/output, and orders reviewed at this time.  Afebrile, heart rate mostly in the 70s to 80s with a couple values 101-109; blood pressure this morning is 98/67, previous values 1 teens over 70s; but had 1 recording of 83/71 last night around 10 PM, before this it was hypertensive. PO 360; UO 2100 ?CMP this morning notable for creatinine of 1.49 (baseline 1.34), mild AST and ALT elevation consistent with hepatosteatosis status post liver retraction during surgery.  Otherwise unremarkable.  WBC 11.9 (6.7 preop), hemoglobin 10.1 (13.3 preop, hemoglobin was 10.0 at 1030 last night); platelets 337 from 370 preop ?Meds reviewed: Tramadol x1 at 11:30 PM, simethicone x2, Dilaudid x1 at 02:42.  ? ?Gen: A&Ox3, no distress  ?H&N: EOMI, atraumatic, neck supple ?Chest: unlabored respirations, RRR ?Abd: soft, minimally tender, distended, incision(s) c/d/i without cellulitis, hematoma or ecchymosis ?Ext: warm, no edema ?Neuro: grossly normal ? ?Lines/tubes/drains: PIV ? ?A/P: Postop day 1 status post sleeve gastrectomy ?-Continue clears and protein shakes ?-Ambulate as tolerated, SCDs while in bed, pulmonary toilet.  We will hold prophylactic Lovenox; clinically no evidence of ongoing or active bleeding and the change in her hemoglobin is not unusual after sleeve, additionally this is stable from last night evaluated this morning however her abdomen is a little bit  distended. ?-Continue to monitor throughout the day today.  Advised to try suppository as well as muscle relaxer to help with the gas pain and distention. ?-Repeat labs in the morning, sooner if needed.  Unclear why she went from so markedly hypertensive to hypotensive; no specific medications or inciting events that I can identify. ? ? ?Phylliss Blakes, MD FACS ?[facility] ? ?  ?

## 2021-08-29 LAB — BASIC METABOLIC PANEL
Anion gap: 6 (ref 5–15)
BUN: 22 mg/dL — ABNORMAL HIGH (ref 6–20)
CO2: 27 mmol/L (ref 22–32)
Calcium: 8.7 mg/dL — ABNORMAL LOW (ref 8.9–10.3)
Chloride: 110 mmol/L (ref 98–111)
Creatinine, Ser: 1.33 mg/dL — ABNORMAL HIGH (ref 0.44–1.00)
GFR, Estimated: 48 mL/min — ABNORMAL LOW (ref >60)
Glucose, Bld: 110 mg/dL — ABNORMAL HIGH (ref 70–99)
Potassium: 4.1 mmol/L (ref 3.5–5.1)
Sodium: 143 mmol/L (ref 135–145)

## 2021-08-29 LAB — MAGNESIUM: Magnesium: 2.1 mg/dL (ref 1.7–2.4)

## 2021-08-29 LAB — CBC
HCT: 23.5 % — ABNORMAL LOW (ref 36.0–46.0)
Hemoglobin: 7.9 g/dL — ABNORMAL LOW (ref 12.0–15.0)
MCH: 30.7 pg (ref 26.0–34.0)
MCHC: 33.6 g/dL (ref 30.0–36.0)
MCV: 91.4 fL (ref 80.0–100.0)
Platelets: 260 10*3/uL (ref 150–400)
RBC: 2.57 MIL/uL — ABNORMAL LOW (ref 3.87–5.11)
RDW: 13.7 % (ref 11.5–15.5)
WBC: 12.7 10*3/uL — ABNORMAL HIGH (ref 4.0–10.5)
nRBC: 0 % (ref 0.0–0.2)

## 2021-08-29 LAB — HEMOGLOBIN AND HEMATOCRIT, BLOOD
HCT: 21.5 % — ABNORMAL LOW (ref 36.0–46.0)
Hemoglobin: 7.3 g/dL — ABNORMAL LOW (ref 12.0–15.0)

## 2021-08-29 MED ORDER — ACETAMINOPHEN 500 MG PO TABS: 1000.0000 mg | ORAL_TABLET | Freq: Three times a day (TID) | ORAL | 0 refills | Status: AC

## 2021-08-29 MED ORDER — ONDANSETRON 4 MG PO TBDP: 4.0000 mg | ORAL_TABLET | Freq: Four times a day (QID) | ORAL | 0 refills | Status: DC | PRN

## 2021-08-29 MED ORDER — PANTOPRAZOLE SODIUM 40 MG PO TBEC: 40.0000 mg | DELAYED_RELEASE_TABLET | Freq: Every day | ORAL | 0 refills | Status: AC

## 2021-08-29 NOTE — Progress Notes (Addendum)
S: She did somewhat better yesterday afternoon, and slept peacefully overnight.  Really no acute events.  No further episodes of hypotension and has been normotensive throughout the day and evening yesterday as well as this morning.  She is tolerating liquids well, notes the typical pain in the epigastrium but otherwise no nausea, no other abdominal pain per se.  Did notice significant relief after having a bowel movement and passing some flatus yesterday.  She was able to walk a few times, no lightheadedness, dizziness or shortness of breath.   O: Vitals, labs, intake/output, and orders reviewed at this time.  Afebrile.  Heart rate 99-1 08, respirations 14-18, blood pressure 1 teens over 70s, saturating well on room air.  P.o. intake yesterday 480 cc.  Urine output 1900+1 occurrence.  Urine output so far today 600.  BMP this morning unremarkable, her creatinine has normalized to her preop baseline of 1.33 (CKD 3a).  CBC reviewed, WBC 12.7 (11.9 yesterday, 9.8 preop), hemoglobin 7.9 (10.1 yesterday, 13.3 preop), platelets 260 (337 yesterday, 370 preop) PRN meds-she has taken simethicone 4 times in the last 24 hours, and 1 dose of Robaxin yesterday morning around 930, but otherwise has required no other medications.  Gen: A&Ox3, no distress  H&N: EOMI, atraumatic, neck supple Chest: unlabored respirations, RRR Abd: soft, very minimally and appropriately tender, nondistended, incision(s) c/d/i with Steri-Strips, minimal evolving ecchymosis but no hematoma, no cellulitis, no hernia Ext: warm, no edema Neuro: grossly normal  Lines/tubes/drains: PIV  A/P: Postop day 2 status post sleeve gastrectomy.  She is progressing appropriately with liquids and mobility, but notably this morning her hemoglobin has drifted down another 2 points.  Lovenox was stopped yesterday before she received her morning dose.  I do suspect that some of this is dilutional as clinically on exam no signs of ongoing or active bleeding.   Will recheck hemoglobin this afternoon.  If she is continuing to do well with liquids and hemoglobin is stable, potential for discharge later today.  Continue to hold Lovenox.   Romana Juniper, MD [facility], [place]

## 2021-08-29 NOTE — TOC Benefit Eligibility Note (Signed)
Transition of Care [facility]) Benefit Eligibility Note    Patient Details  Name: Angela Arroyo MRN: [id number] Date of Birth: [birth year]   Medication/Dose: Enoxaparin 40 mg [place] q12h x 30 days  Covered?: Yes  Tier: Other (tier 4)  Prescription Coverage Preferred Pharmacy: local  Spoke with Person/Company/Phone Number:: [employer]/ [employer] Jarred [phone]  Co-Pay: $15.00  Prior Approval: No  Deductible: Met       Kerin Salen Phone Number: 08/29/2021, 8:58 AM

## 2021-08-29 NOTE — Progress Notes (Signed)
Patient alert and oriented, Post op day 2.  Rounded with Dr. Fredricka Bonine present discussing lab results. Will have labs repeated this afternoon.  Provided support and encouragement.  Encouraged pulmonary toilet, ambulation and small sips of liquids.  All questions answered.  Will continue to monitor.

## 2021-08-30 ENCOUNTER — Emergency Department (HOSPITAL_COMMUNITY)
Admission: EM | Admit: 2021-08-30 | Discharge: 2021-08-31 | Disposition: A | Discharge status: Home / Self Care | Payer: Medicare Other | Attending: Emergency Medicine | Admitting: Emergency Medicine

## 2021-08-30 ENCOUNTER — Other Ambulatory Visit: Payer: Self-pay

## 2021-08-30 ENCOUNTER — Telehealth (HOSPITAL_COMMUNITY): Payer: Self-pay | Admitting: *Deleted

## 2021-08-30 DIAGNOSIS — N189 Chronic kidney disease, unspecified: Secondary | ICD-10-CM | POA: Insufficient documentation

## 2021-08-30 DIAGNOSIS — R000 Tachycardia, unspecified: Secondary | ICD-10-CM | POA: Insufficient documentation

## 2021-08-30 DIAGNOSIS — M7981 Nontraumatic hematoma of soft tissue: Secondary | ICD-10-CM | POA: Insufficient documentation

## 2021-08-30 DIAGNOSIS — I129 Hypertensive chronic kidney disease with stage 1 through stage 4 chronic kidney disease, or unspecified chronic kidney disease: Secondary | ICD-10-CM | POA: Insufficient documentation

## 2021-08-30 DIAGNOSIS — D649 Anemia, unspecified: Secondary | ICD-10-CM | POA: Diagnosis not present

## 2021-08-30 LAB — CBC
HCT: 20.9 % — ABNORMAL LOW (ref 36.0–46.0)
HCT: 21.5 % — ABNORMAL LOW (ref 36.0–46.0)
Hemoglobin: 6.9 g/dL — CL (ref 12.0–15.0)
Hemoglobin: 7.3 g/dL — ABNORMAL LOW (ref 12.0–15.0)
MCH: 30.8 pg (ref 26.0–34.0)
MCH: 30.9 pg (ref 26.0–34.0)
MCHC: 33 g/dL (ref 30.0–36.0)
MCHC: 34 g/dL (ref 30.0–36.0)
MCV: 93.3 fL (ref 80.0–100.0)
MCV: 91.1 fL (ref 80.0–100.0)
Platelets: 219 10*3/uL (ref 150–400)
Platelets: 250 10*3/uL (ref 150–400)
RBC: 2.24 MIL/uL — ABNORMAL LOW (ref 3.87–5.11)
RBC: 2.36 MIL/uL — ABNORMAL LOW (ref 3.87–5.11)
RDW: 13.9 % (ref 11.5–15.5)
RDW: 13.5 % (ref 11.5–15.5)
WBC: 9.2 10*3/uL (ref 4.0–10.5)
WBC: 7.6 10*3/uL (ref 4.0–10.5)
nRBC: 0.7 % — ABNORMAL HIGH (ref 0.0–0.2)
nRBC: 0.7 % — ABNORMAL HIGH (ref 0.0–0.2)

## 2021-08-30 LAB — BASIC METABOLIC PANEL
Anion gap: 7 (ref 5–15)
BUN: 18 mg/dL (ref 6–20)
CO2: 27 mmol/L (ref 22–32)
Calcium: 9.4 mg/dL (ref 8.9–10.3)
Chloride: 106 mmol/L (ref 98–111)
Creatinine, Ser: 1.18 mg/dL — ABNORMAL HIGH (ref 0.44–1.00)
GFR, Estimated: 55 mL/min — ABNORMAL LOW (ref >60)
Glucose, Bld: 89 mg/dL (ref 70–99)
Potassium: 3.8 mmol/L (ref 3.5–5.1)
Sodium: 140 mmol/L (ref 135–145)

## 2021-08-30 LAB — PREPARE RBC (CROSSMATCH)

## 2021-08-30 LAB — SURGICAL PATHOLOGY

## 2021-08-30 MED ORDER — ACETAMINOPHEN 500 MG PO TABS: 1000.0000 mg | ORAL_TABLET | Freq: Four times a day (QID) | ORAL | Status: DC | PRN

## 2021-08-30 MED ORDER — SODIUM CHLORIDE 0.9 % IV BOLUS
1000.0000 mL | Freq: Once | INTRAVENOUS | Status: AC
  Administered 2021-08-30: 1000 mL via INTRAVENOUS

## 2021-08-30 MED ORDER — SODIUM CHLORIDE 0.9 % IV SOLN
10.0000 mL/h | Freq: Once | INTRAVENOUS | Status: DC
Start: 2021-08-30 — End: 2021-08-31

## 2021-08-30 MED ORDER — ONDANSETRON HCL 4 MG/2ML IJ SOLN
4.0000 mg | Freq: Once | INTRAMUSCULAR | Status: AC
  Administered 2021-08-30: 4 mg via INTRAVENOUS
  Filled 2021-08-30: qty 2

## 2021-08-30 NOTE — Plan of Care (Signed)
Instructions were reviewed with patient. All questions were answered. Patient was transported to main entrance by wheelchair. ° °

## 2021-08-30 NOTE — Progress Notes (Signed)
Date and time results received: 08/30/21 0550   Test: Hgb Critical Value: 6.9  Name of Provider Notified: A. Maisie Fus MD  Orders Received? Or Actions Taken?:  No new orders at this time

## 2021-08-30 NOTE — ED Triage Notes (Signed)
Pt is here for blood transfusion. Tuesday she had gastric sleeve and Hbg was 7 something. Dizziness when standing. Denies chest pain or SOB. Denies blood in stool. Not thinners. Headache is chief complaint.

## 2021-08-30 NOTE — Telephone Encounter (Signed)
1.  Tell me about your pain and pain management? Pt states that she has been experiencing consistent abdominal discomfort. Denies nausea and/or vomiting. Pt can tolerate protein shakes and water.  Pt instructed to call [facility] if pain worsens.  2.  Let's talk about fluid intake.  How much total fluid are you taking in? Pt discharged from hospital today.  Reinforced working to meet daily goal of 42f oz.  Pt and husband instructed to refer to Phase 2 handout in discharge folder.  Pt instructed to assess status and suggestions daily utilizing Hydration Action Plan on discharge folder and to call [facility] if in the "red zone".   3.  How much protein have you taken in the last 2 days? Pt states she is meeting her goal of 60g of protein each day with the protein shakes.  Pt states that she has already met her goal for today.  4.  Have you had nausea?  Tell me about when have experienced nausea and what you did to help? Pt denies nausea.   5.  Has the frequency or color changed with your urine? Pt states that she is urinating "fine" with no changes in frequency or urgency.     6.  Tell me what your incisions look like? "Incisions look fine" with the exception of the left most lateral abdominal incision has "a big purple bruise around it about 8in long and 4in tall. Pt instructed to outline bruise with a marker so that she is able to assess it daily.  Pt denies a fever, chills.  Pt states that incisions are not swollen, open, or draining.  Pt encouraged to call [facility] if symptoms worsen.    7.  Have you been passing gas? BM? Pt states that she is having BMs. Last BM 08/30/21.     8.  If a problem or question were to arise who would you call?  Do you know contact numbers for [facility], and [facility]? Pt denies dehydration symptoms.  Pt can describe s/sx of dehydration.  Pt knows to call [facility] for surgical, [facility] for nutrition, and [facility] or concerns.   9.  How has the walking going? Pt states she is  walking around and able to be active without difficulty.   10. Are you still using your incentive spirometer?  If so, how often? Pt states that she forgot her I.S. at discharge.  Discussed with the pt about TCDB and being intentional about performing the lung exercise at least 10x every hour while awake until she sees the surgeon.  11.  How are your vitamins and calcium going?  How are you taking them? Pt states that she will begin the supplements tomorrow.  Reinforced education about taking supplements at least two hours apart.  Due to pt's low hgb, we discussed s/sx to look for if hgb worsens. (tachycardia, SOB, fatigue/weakness, paleness- pt to refer to red zone symptoms on d/c folder) Pt also instructed to call pharmacy to get pantoprazole prescription so that pt may begin taking daily.  Reminded patient that the first 30 days post-operatively are important for successful recovery.  Practice good hand hygiene, wearing a mask when appropriate (since optional in most places), and minimizing exposure to people who live outside of the home, especially if they are exhibiting any respiratory, GI, or illness-like symptoms.

## 2021-08-30 NOTE — ED Provider Notes (Signed)
[facility]   CSN: [id number] Arrival date & time: 08/30/21  1934     History  Chief Complaint  Patient presents with   Anemia    Angela Arroyo is a 56 y.o. female.  Pt is a 56 yo female with a pmhx significant for morbid obesity, htn, gerd, ibs, ckd, arthritis, and depression.  She  had a laparoscopic sleeve gastrectomy with a repair of a small sliding hiatal hernia by Dr. Fredricka Bonineonnor on 5/16.  Pt was discharged this morning.  While she was here, her hemoglobin dropped from 10.1 on 5/17 to 7.9 on the 18th and then down to 6.9 early this am.  Dr. Fredricka Bonineonnor wanted to give the pt a transfusion, but pt really wanted to go home.  Dr. Fredricka Bonineonnor told her to call if she worsened.  This afternoon, pt felt dizzy and weak.  She denies any blood in stool.  She has a lot of bruising to her abdomen.  She called Dr. Dwain SarnaWakefield who was on call.  He told her to come into the ED for a transfusion.  She is in agreement to get a transfusion.      Home Medications Prior to Admission medications   Medication Sig Start Date End Date Taking? Authorizing Provider  acetaminophen (TYLENOL) 500 MG tablet Take 2 tablets (1,000 mg total) by mouth every 8 (eight) hours for 5 days. 08/29/21 09/03/21  Berna Bueonnor, Chelsea A, MD  buPROPion (WELLBUTRIN XL) 300 MG 24 hr tablet Take 300 mg by mouth daily. 04/27/21   [provider]  HYDROcodone-Acetaminophen 7.5-300 MG TABS Take 1 tablet by mouth every 6 (six) hours as needed for moderate pain.    [provider]  levocetirizine (XYZAL) 5 MG tablet Take 5 mg by mouth daily.    [provider]  methylphenidate 36 MG PO CR tablet Take 36 mg by mouth every morning. 07/25/21   [provider]  ondansetron (ZOFRAN-ODT) 4 MG disintegrating tablet Take 1 tablet (4 mg total) by mouth every 6 (six) hours as needed for nausea or vomiting. 08/29/21   Berna Bueonnor, Chelsea A, MD  pantoprazole (PROTONIX) 40 MG tablet Take 1  tablet (40 mg total) by mouth daily. Take daily regardless of reflux symptoms 08/29/21   Berna Bueonnor, Chelsea A, MD  pregabalin (LYRICA) 150 MG capsule Take 300 mg by mouth daily. 07/10/21   [provider]  tiZANidine (ZANAFLEX) 4 MG tablet Take 4 mg by mouth every 6 (six) hours as needed for muscle spasms.    [provider]      Allergies    Ace inhibitors and Percocet [oxycodone-acetaminophen]    Review of Systems   Review of Systems  Neurological:  Positive for weakness.  All other systems reviewed and are negative.  Physical Exam Updated Vital Signs BP 116/76   Pulse 96   Temp 99.4 F (37.4 C) (Oral)   Resp 18   Ht 5\' 5"  (1.651 m)   Wt 103 kg   SpO2 99%   BMI 37.77 kg/m  Physical Exam Vitals and nursing note reviewed.  Constitutional:      Appearance: Normal appearance. She is obese.  HENT:     Head: Normocephalic and atraumatic.     Right Ear: External ear normal.     Left Ear: External ear normal.     Nose: Nose normal.     Mouth/Throat:     Mouth: Mucous membranes are dry.  Eyes:     Extraocular Movements:  Extraocular movements intact.     Conjunctiva/sclera: Conjunctivae normal.     Pupils: Pupils are equal, round, and reactive to light.  Cardiovascular:     Rate and Rhythm: Normal rate and regular rhythm.     Pulses: Normal pulses.     Heart sounds: Normal heart sounds.  Pulmonary:     Effort: Pulmonary effort is normal.     Breath sounds: Normal breath sounds.  Abdominal:     General: Abdomen is flat. A surgical scar is present. Bowel sounds are decreased.     Palpations: Abdomen is soft.     Comments: Several large bruises to abdomen  Surgical areas look good.  Musculoskeletal:        General: Normal range of motion.     Cervical back: Normal range of motion and neck supple.  Skin:    General: Skin is warm.     Capillary Refill: Capillary refill takes less than 2 seconds.     Coloration: Skin is pale.  Neurological:     General: No  focal deficit present.     Mental Status: She is alert.  Psychiatric:        Mood and Affect: Mood normal.        Behavior: Behavior normal.    ED Results / Procedures / Treatments   Labs (all labs ordered are listed, but only abnormal results are displayed) Labs Reviewed  CBC - Abnormal; Notable for the following components:      Result Value   RBC 2.36 (*)    Hemoglobin 7.3 (*)    HCT 21.5 (*)    nRBC 0.7 (*)    All other components within normal limits  BASIC METABOLIC PANEL - Abnormal; Notable for the following components:   Creatinine, Ser 1.18 (*)    GFR, Estimated 55 (*)    All other components within normal limits  URINALYSIS, ROUTINE W REFLEX MICROSCOPIC  TYPE AND SCREEN  PREPARE RBC (CROSSMATCH)    EKG None  Radiology No results found.  Procedures Procedures    Medications Ordered in ED Medications  acetaminophen (TYLENOL) tablet 1,000 mg (has no administration in time range)  sodium chloride 0.9 % bolus 1,000 mL (has no administration in time range)  ondansetron (ZOFRAN) injection 4 mg (has no administration in time range)  0.9 %  sodium chloride infusion (has no administration in time range)    ED Course/ Medical Decision Making/ A&P                           Medical Decision Making Amount and/or Complexity of Data Reviewed Labs: ordered.  Risk Prescription drug management.   This patient presents to the ED for concern of weakness, this involves an extensive number of treatment options, and is a complaint that carries with it a high risk of complications and morbidity.  The differential diagnosis includes anemia, electrolyte abn, infection   Co morbidities that complicate the patient evaluation  morbid obesity, htn, gerd, ibs, ckd, arthritis, and depression   Additional history obtained:  Additional history obtained from epic chart review External records from outside source obtained and reviewed including husband   Lab Tests:  I  Ordered, and personally interpreted labs.  The pertinent results include:  hgb 7.3; bmp nl  Cardiac Monitoring:  The patient was maintained on a cardiac monitor.  I personally viewed and interpreted the cardiac monitored which showed an underlying rhythm of: sinus tachy   Medicines  ordered and prescription drug management:  I ordered medication including blood  for transfusion  I have reviewed the patients home medicines and have made adjustments as needed   Critical Interventions:  transfusion   Consultations Obtained:  I requested consultation with the gen surg (Dr. Dwain Sarna),  and discussed lab and imaging findings as well as pertinent plan - he recommends transfusion then likely d/c home.  Pt is good with this plan.   Problem List / ED Course:  Symptomatic anemia:  transfusion 1 unit   Social Determinants of Health:  Lives at home with husband   Dispostion:  Plan is for d/c after transfusion if she is feeling better.  If not, pt will need to be admitted to surgery.  However, she is motivated to go home.  Pt signed out to Dr. Blinda Leatherwood at shift change.  CRITICAL CARE Performed by: Jacalyn Lefevre   Total critical care time: 30 minutes  Critical care time was exclusive of separately billable procedures and treating other patients.  Critical care was necessary to treat or prevent imminent or life-threatening deterioration.  Critical care was time spent personally by me on the following activities: development of treatment plan with patient and/or surrogate as well as nursing, discussions with consultants, evaluation of patient's response to treatment, examination of patient, obtaining history from patient or surrogate, ordering and performing treatments and interventions, ordering and review of laboratory studies, ordering and review of radiographic studies, pulse oximetry and re-evaluation of patient's condition.         Final Clinical Impression(s) / ED  Diagnoses Final diagnoses:  Symptomatic anemia    Rx / DC Orders ED Discharge Orders     None         Jacalyn Lefevre, MD 08/30/21 2257

## 2021-08-30 NOTE — Discharge Summary (Signed)
Physician Discharge Summary  Patient ID: Angela Arroyo MRN: [id number] DOB/AGE: 54-Jul-1967 54 y.o.  Admit date: 08/27/2021 Discharge date: 08/30/2021  Admission Diagnoses: morbid obesity  Discharge Diagnoses:  Principal Problem:   Morbid obesity (HCC)   Discharged Condition: good  Hospital Course: She was admitted for routine postoperative care following a uneventful laparoscopic sleeve gastrectomy with repair of a small sliding hiatal hernia.  On postop day 1, the patient was tolerating liquids and was ambulating well.  She did have some abdominal distention that resolved after suppository with resultant flatus and small bowel movement.  She did have a mild increase in her creatinine in the setting of baseline CKD 3A.  On postop day 2, the patient's hemoglobin was noted to have dropped to 7.9; her creatinine had returned to normal.  She did have mild tachycardia associated with this but was normotensive, asymptomatic otherwise and continued to tolerate liquids well, ambulate well, and had minimal postoperative abdominal pain.  On recheck later that afternoon her hemoglobin did slowly trend down, and the downward trend did slow significantly by the morning of postop day 3.  On the morning of postop day 3, the patient was clinically well, walking, tolerating liquids, and without any significant symptoms other than minimal tachycardia.  A thorough discussion was had with the patient and her husband regarding indications/recommendation for transfusion.  She expressed good understanding of signs and symptoms that should prompt her to seek further treatment, but at this time wish to be discharged due to severe discomfort related to positioning in the hospital bed in hospital recliner in the setting of pre-existing spine issues.  Given the significantly slowed trend in her acute blood loss anemia and otherwise normal lab work and physical exam, a collaborative decision was made with the patient to proceed  with discharge with close follow-up.  Discharge Exam: Blood pressure 115/81, pulse (!) 101, temperature 98.5 F (36.9 C), temperature source Oral, resp. rate 17, height 5\' 5"  (1.651 m), weight 102.9 kg, SpO2 97 %. Alert, well-appearing Unlabored respirations Abdomen is soft, nontender, nondistended.  Incisions clean dry intact with Steri-Strips.  No cellulitis or hematoma. No lower extremity edema  Disposition: Discharge disposition: 01-Home or Self Care        Allergies as of 08/30/2021       Reactions   Ace Inhibitors Other (See Comments)   cough   Percocet [oxycodone-acetaminophen] Itching   Patient states she can take plain tylenol without difficulty.        Medication List     STOP taking these medications    chlorthalidone 25 MG tablet Commonly known as: HYGROTON   DEXILANT PO   potassium chloride SA 20 MEQ tablet Commonly known as: KLOR-CON M       TAKE these medications    acetaminophen 500 MG tablet Commonly known as: TYLENOL Take 2 tablets (1,000 mg total) by mouth every 8 (eight) hours for 5 days.   buPROPion 300 MG 24 hr tablet Commonly known as: WELLBUTRIN XL Take 300 mg by mouth daily.   HYDROcodone-Acetaminophen 7.5-300 MG Tabs Take 1 tablet by mouth every 6 (six) hours as needed for moderate pain.   levocetirizine 5 MG tablet Commonly known as: XYZAL Take 5 mg by mouth daily.   methylphenidate 36 MG CR tablet Commonly known as: CONCERTA Take 36 mg by mouth every morning.   ondansetron 4 MG disintegrating tablet Commonly known as: ZOFRAN-ODT Take 1 tablet (4 mg total) by mouth every 6 (six) hours as needed for  nausea or vomiting.   pantoprazole 40 MG tablet Commonly known as: PROTONIX Take 1 tablet (40 mg total) by mouth daily. Take daily regardless of reflux symptoms   pregabalin 150 MG capsule Commonly known as: LYRICA Take 300 mg by mouth daily.   tiZANidine 4 MG tablet Commonly known as: ZANAFLEX Take 4 mg by mouth  every 6 (six) hours as needed for muscle spasms.        Follow-up Information     Berna Bue, MD. Go on 09/19/2021.   Specialty: General Surgery Why: at 11:30am.  Please arrive 15 minutes prior to your appointment time.  Thank you. Contact information: [address] [place] [address] [phone]         Berna Bue, MD. Go on 10/16/2021.   Specialty: General Surgery Why: at 11:30am.  Please arrive 15 minutes prior to your appointment time.  Thank you. Contact information: [address] [place] [address] [phone]                 Signed: Berna Bue 08/30/2021, 8:46 AM

## 2021-08-31 LAB — URINALYSIS, ROUTINE W REFLEX MICROSCOPIC
Bilirubin Urine: NEGATIVE
Glucose, UA: NEGATIVE mg/dL
Hgb urine dipstick: NEGATIVE
Ketones, ur: 20 mg/dL — AB
Nitrite: NEGATIVE
Protein, ur: NEGATIVE mg/dL
Specific Gravity, Urine: 1.014 (ref 1.005–1.030)
pH: 6 (ref 5.0–8.0)

## 2021-08-31 MED ORDER — HYDROMORPHONE HCL 1 MG/ML IJ SOLN
0.5000 mg | Freq: Once | INTRAMUSCULAR | Status: AC
  Administered 2021-08-31: 0.5 mg via INTRAVENOUS
  Filled 2021-08-31: qty 1

## 2021-08-31 NOTE — ED Provider Notes (Signed)
Patient signed out to me by Dr. Particia Nearing pending blood transfusion.  Patient with recent surgery, was anemic before discharge, declined blood transfusion.  She came back today because she is having some weakness and dizziness.  There was some delay in obtaining the blood but it has been administered and she did well.  I did have to give her some Dilaudid for an acute cramp of the right side of her neck.  Examination revealed tenderness of the paraspinal muscles, no midline tenderness, no weakness of upper extremities.  Pain medication help, she is feeling much better and will be discharged.   Gilda Crease, MD 08/31/21 [phone]

## 2021-08-31 NOTE — ED Notes (Signed)
Patient ambulated to bathroom and back without difficulty - urine sample obtained and sent

## 2021-08-31 NOTE — ED Notes (Signed)
Called lab to ensure type and screen was completed  - per [facility] in the lab states it has been completed -

## 2021-08-31 NOTE — ED Notes (Signed)
Patient tolerated blood infusion without adverse side effects - patient educated on side effects to monitor once home as well -

## 2021-09-01 LAB — TYPE AND SCREEN
ABO/RH(D): A POS
Antibody Screen: NEGATIVE
Unit division: 0

## 2021-09-01 LAB — BPAM RBC
Blood Product Expiration Date: 202306142359
ISSUE DATE / TIME: 202305200228
Unit Type and Rh: 6200

## 2021-09-03 ENCOUNTER — Telehealth (HOSPITAL_COMMUNITY): Payer: Self-pay | Admitting: *Deleted

## 2021-09-03 NOTE — Telephone Encounter (Signed)
I checked in with the patient today. She is doing much better.  She is able to meet fluid and protein goals and has an appointment this afternoon for a post-transfusion hgb check.  Pt states that abdominal bruises has gotten "somewhat bigger', but is not experiencing as much discomfort now that she is retaking her home medications as well.  Will continue to monitor as needed.

## 2021-09-10 ENCOUNTER — Encounter: Payer: Medicare Other | Attending: Surgery | Admitting: Dietician

## 2021-09-10 ENCOUNTER — Encounter: Payer: Self-pay | Admitting: Dietician

## 2021-09-10 DIAGNOSIS — Z6835 Body mass index (BMI) 35.0-35.9, adult: Secondary | ICD-10-CM | POA: Diagnosis not present

## 2021-09-10 DIAGNOSIS — E785 Hyperlipidemia, unspecified: Secondary | ICD-10-CM | POA: Diagnosis not present

## 2021-09-10 DIAGNOSIS — N183 Chronic kidney disease, stage 3 unspecified: Secondary | ICD-10-CM | POA: Insufficient documentation

## 2021-09-10 DIAGNOSIS — K219 Gastro-esophageal reflux disease without esophagitis: Secondary | ICD-10-CM | POA: Diagnosis not present

## 2021-09-10 DIAGNOSIS — I129 Hypertensive chronic kidney disease with stage 1 through stage 4 chronic kidney disease, or unspecified chronic kidney disease: Secondary | ICD-10-CM | POA: Insufficient documentation

## 2021-09-10 DIAGNOSIS — Z713 Dietary counseling and surveillance: Secondary | ICD-10-CM | POA: Diagnosis not present

## 2021-09-10 DIAGNOSIS — E669 Obesity, unspecified: Secondary | ICD-10-CM | POA: Insufficient documentation

## 2021-09-10 NOTE — Progress Notes (Signed)
2 Week Post-Operative Nutrition Class   Patient was seen on 09/10/2021 for Post-Operative Nutrition education at the [facility].    Surgery date: 08/27/2021 Surgery type: Sleeve Gastrectomy   Anthropometrics  Start weight at [facility]: 223.8 lbs (date: 05/16/2021)  Height: 65 in Weight: 211.6 lbs BMI: 35.21 kg/m2     Clinical  Medical hx: IBS, diverticulitis, GERD, HTN, CKD stage 3 Medications: see list  Labs: potassium 3.0, creatinine 1.07 Notable signs/symptoms: acid reflux, constipation, headaches, backaches Any previous deficiencies? No   Body Composition Scale 09/10/2021  Current Body Weight 211.6  Total Body Fat % 41.2  Visceral Fat 13  Fat-Free Mass % 58.7   Total Body Water % 43.8  Muscle-Mass lbs 31.1  BMI 34.9  Body Fat Displacement          Torso  lbs 54.1         Left Leg  lbs 10.8         Right Leg  lbs 10.8         Left Arm  lbs 5.4         Right Arm   lbs 5.4      The following the learning objectives were met by the patient during this course: Identifies Phase 3 (Soft, High Proteins) Dietary Goals and will begin from 2 weeks post-operatively to 2 months post-operatively Identifies appropriate sources of fluids and proteins  Identifies appropriate fat sources and healthy verses unhealthy fat types   States protein recommendations and appropriate sources post-operatively Identifies the need for appropriate texture modifications, mastication, and bite sizes when consuming solids Identifies appropriate fat consumption and sources Identifies appropriate multivitamin and calcium sources post-operatively Describes the need for physical activity post-operatively and will follow MD recommendations States when to call healthcare provider regarding medication questions or post-operative complications   Handouts given during class include: Phase 3A: Soft, High Protein Diet Handout Phase 3 High Protein Meals Healthy Fats   Follow-Up  Plan: Patient will follow-up at [facility] in 6 weeks for 2 month post-op nutrition visit for diet advancement per MD.

## 2021-09-16 ENCOUNTER — Telehealth: Payer: Self-pay | Admitting: Dietician

## 2021-09-16 NOTE — Telephone Encounter (Signed)
RD called pt to verify fluid intake once starting soft, solid proteins 2 week post-bariatric surgery.   Daily Fluid intake: 50-54 oz. Daily Protein intake: 60 g. Bowel Habits: some constipation, taking colace prescribed in the hospital and Miralax.  Concerns/issues:

## 2021-10-22 ENCOUNTER — Encounter: Payer: Self-pay | Admitting: Dietician

## 2021-10-22 ENCOUNTER — Encounter: Payer: Medicare Other | Attending: Surgery | Admitting: Dietician

## 2021-10-22 DIAGNOSIS — Z9884 Bariatric surgery status: Secondary | ICD-10-CM | POA: Insufficient documentation

## 2021-10-22 DIAGNOSIS — E669 Obesity, unspecified: Secondary | ICD-10-CM

## 2021-10-22 DIAGNOSIS — Z713 Dietary counseling and surveillance: Secondary | ICD-10-CM | POA: Insufficient documentation

## 2021-10-22 DIAGNOSIS — I129 Hypertensive chronic kidney disease with stage 1 through stage 4 chronic kidney disease, or unspecified chronic kidney disease: Secondary | ICD-10-CM | POA: Insufficient documentation

## 2021-10-22 DIAGNOSIS — N189 Chronic kidney disease, unspecified: Secondary | ICD-10-CM | POA: Diagnosis not present

## 2021-10-22 NOTE — Progress Notes (Signed)
Bariatric Nutrition Follow-Up Visit Medical Nutrition Therapy  Appt Start Time: 2:00   End Time: 2:31  Surgery date: 08/27/2021 Surgery type: Sleeve Gastrectomy   NUTRITION ASSESSMENT  Anthropometrics  Start weight at [facility]: 223.8 lbs (date: 05/16/2021)  Height: 65 in Weight:  lbs BMI:  kg/m2     Clinical  Medical hx: IBS, diverticulitis, GERD, HTN, CKD stage 3 Medications: see list  Labs: potassium 3.0, creatinine 1.07 Notable signs/symptoms: acid reflux, constipation, headaches, backaches Any previous deficiencies? No   Body Composition Scale 09/10/2021 10/22/2021  Current Body Weight 211.6 198.5  Total Body Fat % 41.2 39.4  Visceral Fat 13 12  Fat-Free Mass % 58.7 60.5   Total Body Water % 43.8 44.7  Muscle-Mass lbs 31.1 31.0  BMI 34.9 32.7  Body Fat Displacement           Torso  lbs 54.1 48.3         Left Leg  lbs 10.8 9.6         Right Leg  lbs 10.8 9.6         Left Arm  lbs 5.4 4.8         Right Arm   lbs 5.4 4.8     Lifestyle & Dietary Hx  Pt states she does not get all her fluids, stating she is watching her grandchildren and spending time at the pool.  Pt states she does not want to drink too late at night, or she will be up at night to go to the bathroom.  Dietitian advised patient to start early in the day to get fluids in. Pt states her bowel movements are doing well, stating if she hasn't gone in a couple of days, she drinks coffee to help her with bowel movements. Pt is content with her physical activity at her backyard pool and walking the dogs.  Dietitian recommended physical activity and pt states she spends time at the pool and walking the dogs.  Estimated daily fluid intake: 50+ oz Estimated daily protein intake: 60 g Supplements: multi vitamin and calcium Current average weekly physical activity: pool in the backyard, walk the dogs in the evening.  24-Hr Dietary Recall First Meal: egg or protein shake Snack: nuts  Second Meal: sandwich meat or  tuna Snack: protein granola bar or cheese or sandwich meat Third Meal: chicken or fish and green bean black-eye peas or pintos Snack: granola bar or granola cereal Beverages: water, coffee, crystal light, flavor drops  Post-Op Goals/ Signs/ Symptoms Using straws: no Drinking while eating: no Chewing/swallowing difficulties: no Changes in vision: no Changes to mood/headaches: no Hair loss/changes to skin/nails: no Difficulty focusing/concentrating: no Sweating: no Limb weakness: no Dizziness/lightheadedness: no Palpitations: no  Carbonated/caffeinated beverages: no N/V/D/C/Gas: no Abdominal pain: no Dumping syndrome: no    NUTRITION DIAGNOSIS  Overweight/obesity ([place]-3.3) related to past poor dietary habits and physical inactivity as evidenced by completed bariatric surgery and following dietary guidelines for continued weight loss and healthy nutrition status.     NUTRITION INTERVENTION Nutrition counseling (C-1) and education (E-2) to facilitate bariatric surgery goals, including: Diet advancement to the next phase (phase 4) now including non-starchy vegetables The importance of consuming adequate calories as well as certain nutrients daily due to the body's need for essential vitamins, minerals, and fats The importance of daily physical activity and to reach a goal of at least 150 minutes of moderate to vigorous physical activity weekly (or as directed by their physician) due to benefits such as increased  musculature and improved lab values The importance of intuitive eating specifically learning hunger-satiety cues and understanding the importance of learning a new body: The importance of mindful eating to avoid grazing behaviors   Handouts Provided Include  Phase 4 Food Plan  Learning Style & Readiness for Change Teaching method utilized: Visual & Auditory  Demonstrated degree of understanding via: Teach Back  Readiness Level: contemplative  Barriers to learning/adherence  to lifestyle change: old habits; resistance to change  RD's Notes for Next Visit Assess adherence to pt chosen goals   MONITORING & EVALUATION Dietary intake, weekly physical activity, body weight.  Next Steps Patient is to follow-up in October for 6 month post-op class.

## 2021-12-31 ENCOUNTER — Ambulatory Visit: Payer: Medicare Other | Admitting: Allergy

## 2022-01-21 ENCOUNTER — Ambulatory Visit: Payer: Medicare Other | Admitting: Skilled Nursing Facility1

## 2022-02-25 ENCOUNTER — Encounter: Payer: Medicare Other | Attending: Surgery | Admitting: Skilled Nursing Facility1

## 2022-02-25 DIAGNOSIS — Z713 Dietary counseling and surveillance: Secondary | ICD-10-CM | POA: Insufficient documentation

## 2022-02-25 DIAGNOSIS — Z9884 Bariatric surgery status: Secondary | ICD-10-CM | POA: Insufficient documentation

## 2022-02-27 ENCOUNTER — Encounter: Payer: Self-pay | Admitting: Skilled Nursing Facility1

## 2022-02-27 NOTE — Progress Notes (Signed)
Follow-up visit:  Post-Operative sleeve Surgery  Medical Nutrition Therapy:  Appt start time: 6:00pm end time:  7:00pm  Primary concerns today: Post-operative Bariatric Surgery Nutrition Management 6 Month Post-Op Class Surgery date: 08/27/2021 Surgery type: Sleeve Gastrectomy   NUTRITION ASSESSMENT  Anthropometrics  Start weight at [facility]: 223.8 lbs (date: 05/16/2021)  Height: 65 in Weight: 172.1   Clinical  Medical hx: IBS, diverticulitis, GERD, HTN, CKD stage 3 Medications: see list  Labs: potassium 3.0, creatinine 1.07 Notable signs/symptoms: acid reflux, constipation, headaches, backaches Any previous deficiencies? No   Body Composition Scale 09/10/2021 10/22/2021 02/27/2022  Current Body Weight 211.6 198.5 172.1  Total Body Fat % 41.2 39.4 34.8  Visceral Fat 13 12 9   Fat-Free Mass % 58.7 60.5 65.1   Total Body Water % 43.8 44.7 47  Muscle-Mass lbs 31.1 31.0 30.7  BMI 34.9 32.7 28.3  Body Fat Displacement            Torso  lbs 54.1 48.3 37         Left Leg  lbs 10.8 9.6 7.4         Right Leg  lbs 10.8 9.6 7.4         Left Arm  lbs 5.4 4.8 3.7         Right Arm   lbs 5.4 4.8 3.7     Information Reviewed/ Discussed During Appointment: -Review of composition scale numbers -Fluid requirements (64-100 ounces) -Protein requirements (60-80g) -Strategies for tolerating diet -Advancement of diet to include Starchy vegetables -Barriers to inclusion of new foods -Inclusion of appropriate multivitamin and calcium supplements  -Exercise recommendations   Fluid intake: adequate   Medications: See List Supplementation: appropriate    Using straws: no Drinking while eating: no Having you been chewing well: yes Chewing/swallowing difficulties: no Changes in vision: no Changes to mood/headaches: no Hair loss/Cahnges to skin/Changes to nails: no Any difficulty focusing or concentrating: no Sweating: no Dizziness/Lightheaded: no Palpitations: no  Carbonated beverages:  no N/V/D/C/GAS: no Abdominal Pain: no Dumping syndrome: no  Recent physical activity:  ADL's  Progress Towards Goal(s):  In Progress Teaching method utilized: Visual & Auditory  Demonstrated degree of understanding via: Teach Back  Readiness Level: Action Barriers to learning/adherence to lifestyle change: none identified  Handouts given during visit include: Phase V diet Progression  Goals Sheet The Benefits of Exercise are endless..... Support Group Topics   Teaching Method Utilized:  Visual Auditory Hands on  Demonstrated degree of understanding via:  Teach Back   Monitoring/Evaluation:  Dietary intake, exercise, and body weight. Follow up in 3 months for 9 month post-op visit.

## 2022-06-03 ENCOUNTER — Encounter: Payer: Medicare Other | Attending: Surgery | Admitting: Skilled Nursing Facility1

## 2022-06-03 ENCOUNTER — Encounter: Payer: Self-pay | Admitting: Skilled Nursing Facility1

## 2022-06-03 DIAGNOSIS — K219 Gastro-esophageal reflux disease without esophagitis: Secondary | ICD-10-CM | POA: Diagnosis not present

## 2022-06-03 DIAGNOSIS — I129 Hypertensive chronic kidney disease with stage 1 through stage 4 chronic kidney disease, or unspecified chronic kidney disease: Secondary | ICD-10-CM | POA: Diagnosis not present

## 2022-06-03 DIAGNOSIS — Z713 Dietary counseling and surveillance: Secondary | ICD-10-CM | POA: Insufficient documentation

## 2022-06-03 DIAGNOSIS — N183 Chronic kidney disease, stage 3 unspecified: Secondary | ICD-10-CM | POA: Diagnosis not present

## 2022-06-03 DIAGNOSIS — Z3A28 28 weeks gestation of pregnancy: Secondary | ICD-10-CM | POA: Diagnosis not present

## 2022-06-03 NOTE — Progress Notes (Signed)
Follow-up visit:  Post-Operative sleeve Surgery    Primary concerns today: Post-operative Bariatric Surgery Nutrition Management  Surgery date: 08/27/2021 Surgery type: Sleeve Gastrectomy   NUTRITION ASSESSMENT  Anthropometrics  Start weight at [facility]: 223.8 lbs (date: 05/16/2021)  Height: 65 in Weight: 173.5 pounds   Clinical  Medical hx: IBS, diverticulitis, GERD, HTN, CKD stage 3 Medications: see list  Labs: potassium 3.0, creatinine 1.07 Notable signs/symptoms: acid reflux, constipation, headaches, backaches Any previous deficiencies? No   Body Composition Scale 09/10/2021 10/22/2021 02/27/2022 06/03/2022  Current Body Weight 211.6 198.5 172.1 173.5  Total Body Fat % 41.2 39.4 34.8 35.1  Visceral Fat 13 12 9 9  $ Fat-Free Mass % 58.7 60.5 65.1 64.8   Total Body Water % 43.8 44.7 47 46.9  Muscle-Mass lbs 31.1 31.0 30.7 30.7  BMI 34.9 32.7 28.3 28.5  Body Fat Displacement             Torso  lbs 54.1 48.3 37 37.6         Left Leg  lbs 10.8 9.6 7.4 7.5         Right Leg  lbs 10.8 9.6 7.4 7.5         Left Arm  lbs 5.4 4.8 3.7 3.7         Right Arm   lbs 5.4 4.8 3.7 3.7    Pt states she got a breast lift and arm lift.  Pt states she has completely avoided drinking soda.  Pt states she feels really good about her progress and diet.    24 hr recall: First meal: 1-2 eggs sometimes with cheese sometimes with pumpernickle  Snack: cashews Second meal: ham sandwich + mayo Snack: pretzels  Third meal: chicken or beef + beans  Beverages: water, (90%) water + flavoring    Fluid intake: 118 ounces  Medications: See List; back on acid reflux medicine  Supplementation: multi and calcium    Using straws: no Drinking while eating: no Having you been chewing well: yes Chewing/swallowing difficulties: no Changes in vision: no Changes to mood/headaches: no Hair loss/Cahnges to skin/Changes to nails: no Any difficulty focusing or concentrating: no Sweating:  no Dizziness/Lightheaded: no Palpitations: no  Carbonated beverages: no N/V/D/C/GAS: states she has always had a problem with constipation so started back on metamucil gummys   Abdominal Pain: no Dumping syndrome: no  Recent physical activity:  ADL's  Progress Towards Goal(s):  In Progress Teaching method utilized: Environmental health practitioner & Auditory  Demonstrated degree of understanding via: Teach Back  Readiness Level: Action Barriers to learning/adherence to lifestyle change: none identified   Teaching Method Utilized:  Visual Auditory Hands on  Demonstrated degree of understanding via:  Teach Back   Monitoring/Evaluation:  Dietary intake, exercise, and body weight. Follow up in May

## 2022-09-02 ENCOUNTER — Ambulatory Visit: Payer: Medicare Other | Admitting: Skilled Nursing Facility1

## 2022-10-01 ENCOUNTER — Ambulatory Visit: Payer: Medicare Other | Admitting: Skilled Nursing Facility1

## 2023-02-06 IMAGING — CR DG CHEST 2V
2 series · 2 of 2 positions shown · non-contrast
Comparison: 04/02/2020

CLINICAL DATA: Morbid obesity, preoperative for gastric surgery.

EXAM:
CHEST - 2 VIEW

[w chest pa]
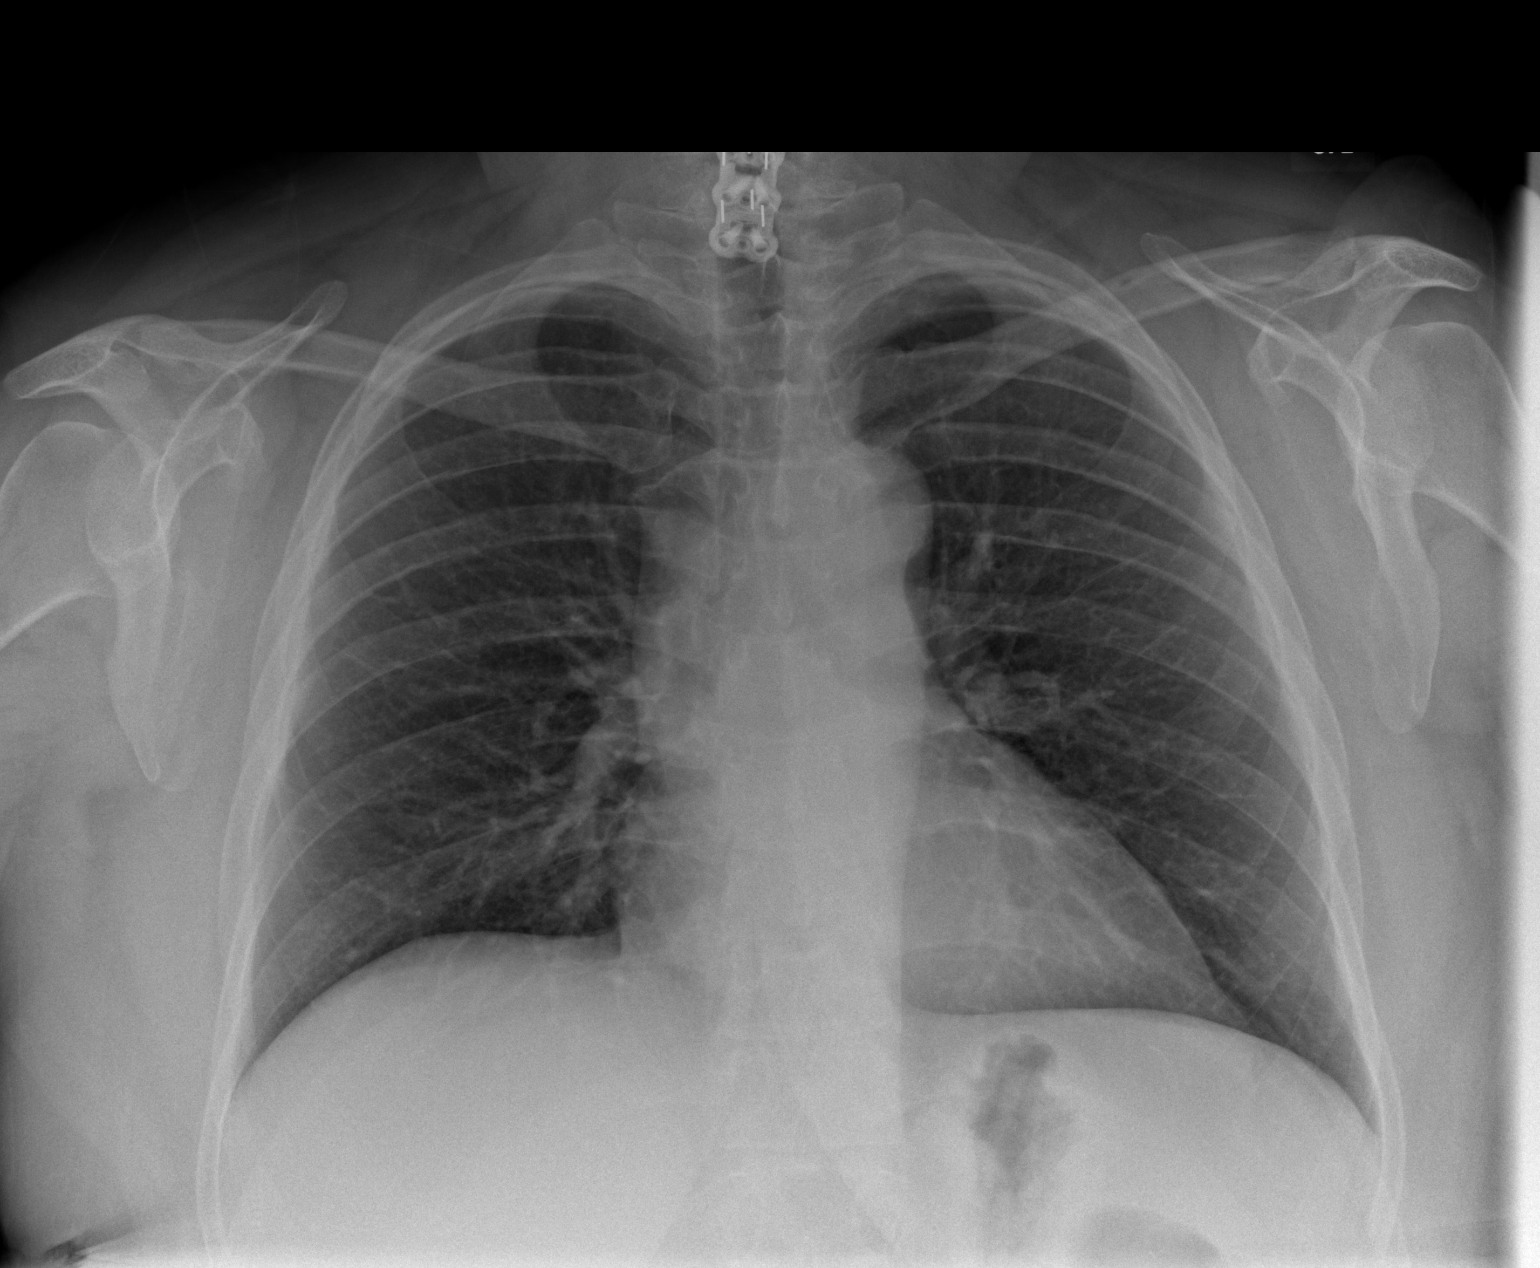

[w chest lat]
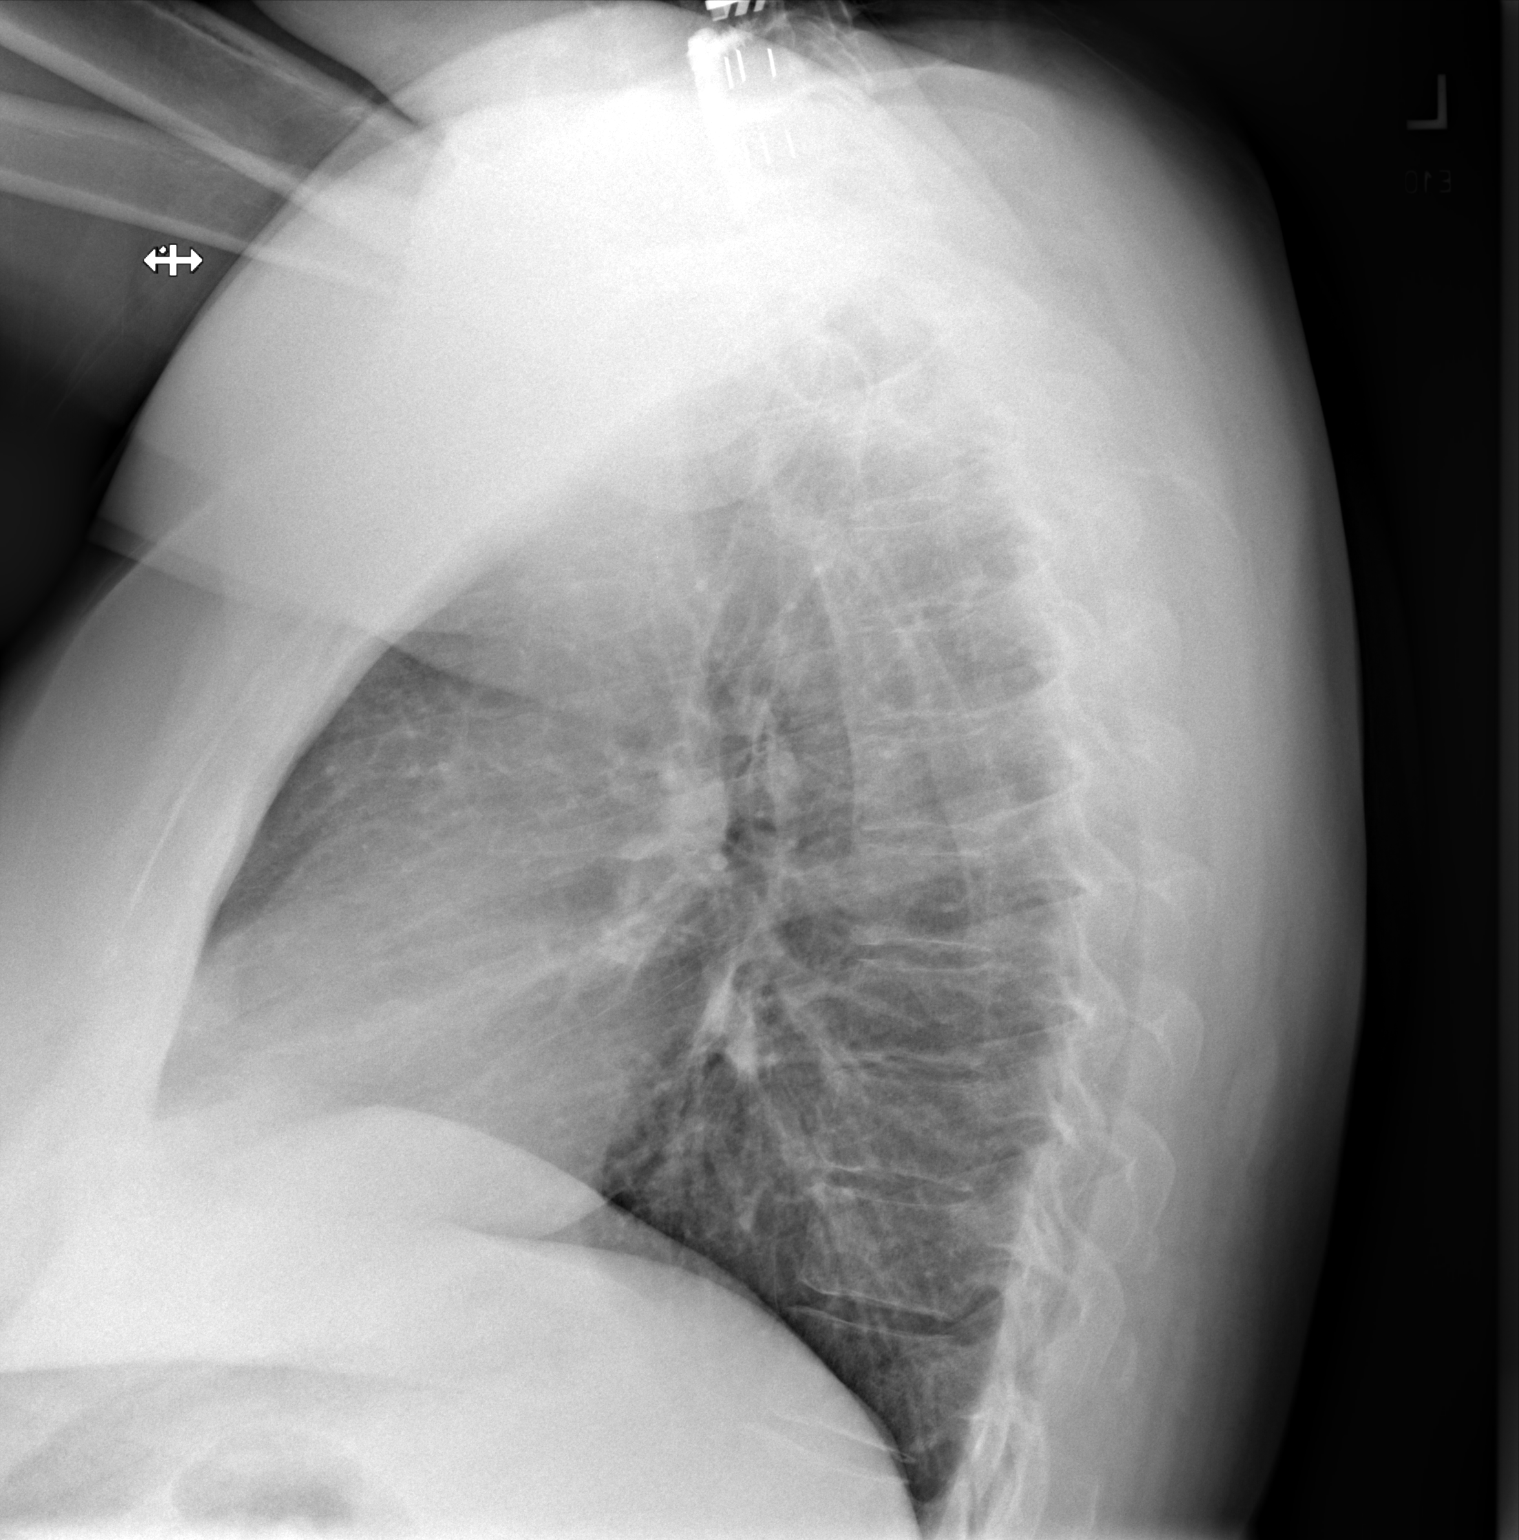

[2 of 2 positions shown; findings below may reference images not displayed]

FINDINGS: Cervical spine fixation hardware noted.

The lungs appear clear. Cardiac and mediastinal contours normal. No
pleural effusion identified.
IMPRESSION: 1.  No active cardiopulmonary disease is radiographically apparent.
2. Cervical spine fixation hardware noted.

## 2023-04-02 ENCOUNTER — Encounter (HOSPITAL_COMMUNITY): Payer: Self-pay | Admitting: *Deleted

## 2023-04-16 ENCOUNTER — Ambulatory Visit (HOSPITAL_BASED_OUTPATIENT_CLINIC_OR_DEPARTMENT_OTHER)
Admission: RE | Admit: 2023-04-16 | Discharge: 2023-04-16 | Disposition: A | Discharge status: Home / Self Care | Payer: Medicare Other | Source: Ambulatory Visit | Attending: Family Medicine | Admitting: Family Medicine

## 2023-04-16 ENCOUNTER — Encounter (HOSPITAL_BASED_OUTPATIENT_CLINIC_OR_DEPARTMENT_OTHER): Payer: Self-pay

## 2023-04-16 VITALS — BP 130/76 | HR 95 | Temp 99.5°F | Resp 20

## 2023-04-16 DIAGNOSIS — N390 Urinary tract infection, site not specified: Secondary | ICD-10-CM | POA: Insufficient documentation

## 2023-04-16 LAB — POCT URINALYSIS DIP (MANUAL ENTRY)
Bilirubin, UA: NEGATIVE
Glucose, UA: NEGATIVE mg/dL
Nitrite, UA: POSITIVE — AB
Protein Ur, POC: 100 mg/dL — AB
Spec Grav, UA: >=1.03 — AB
Urobilinogen, UA: 0.2 U/dL
pH, UA: 6

## 2023-04-16 LAB — POCT INFLUENZA A/B
Influenza A, POC: NEGATIVE
Influenza B, POC: NEGATIVE

## 2023-04-16 MED ORDER — CEPHALEXIN 500 MG PO CAPS: 500.0000 mg | ORAL_CAPSULE | Freq: Three times a day (TID) | ORAL | 0 refills | Status: AC
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
## 2023-04-16 NOTE — ED Triage Notes (Signed)
 Pt c/o not feeling good x 2 days ago, body aches, headache, no fever, fatigue, chills.

## 2023-04-16 NOTE — Discharge Instructions (Addendum)
 Go to the emergency department for high fever, worsening back pain, vomiting, abdominal pain, concerns Increase fluid intake Monitor temperature Take the antibiotics as prescribed Your flu test is negative

## 2023-04-18 LAB — URINE CULTURE: Culture: >=100000 — AB

## 2023-06-18 ENCOUNTER — Encounter (HOSPITAL_BASED_OUTPATIENT_CLINIC_OR_DEPARTMENT_OTHER): Payer: Self-pay

## 2023-06-18 ENCOUNTER — Ambulatory Visit (HOSPITAL_BASED_OUTPATIENT_CLINIC_OR_DEPARTMENT_OTHER)
Admission: RE | Admit: 2023-06-18 | Discharge: 2023-06-18 | Disposition: A | Discharge status: Home / Self Care | Payer: Self-pay | Source: Ambulatory Visit | Attending: Family Medicine | Admitting: Family Medicine

## 2023-06-18 VITALS — BP 143/94 | HR 71 | Temp 98.1°F | Resp 20

## 2023-06-18 DIAGNOSIS — N3001 Acute cystitis with hematuria: Secondary | ICD-10-CM

## 2023-06-18 LAB — POCT URINALYSIS DIP (MANUAL ENTRY)
Bilirubin, UA: NEGATIVE
Glucose, UA: NEGATIVE mg/dL
Ketones, POC UA: NEGATIVE mg/dL
Nitrite, UA: POSITIVE — AB
Protein Ur, POC: 100 mg/dL — AB
Spec Grav, UA: >=1.03 — AB (ref 1.010–1.025)
Urobilinogen, UA: 0.2 U/dL
pH, UA: 5.5 (ref 5.0–8.0)

## 2023-06-18 MED ORDER — CEPHALEXIN 500 MG PO CAPS: 500.0000 mg | ORAL_CAPSULE | Freq: Three times a day (TID) | ORAL | 0 refills | Status: AC

## 2023-06-18 NOTE — Discharge Instructions (Signed)
 Take the antibiotics as prescribed to treat your urinary tract infection.  Hopefully this will cover any upper respiratory/sinus infection.  If symptoms continue or worsen please follow-up

## 2023-06-18 NOTE — ED Triage Notes (Signed)
"  I think I have a sinus infection and maybe a UTI." States feels pressure to eyes. Urine is cloudy. Leaves for a cruise this weekend. Recent illness of Infl. A
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
## 2023-06-21 LAB — URINE CULTURE: Culture: >=100000 — AB

## 2023-06-24 ENCOUNTER — Telehealth: Payer: Self-pay

## 2023-06-24 MED ORDER — FLUCONAZOLE 150 MG PO TABS: 150.0000 mg | ORAL_TABLET | Freq: Once | ORAL | 0 refills | Status: AC

## 2023-06-24 NOTE — Telephone Encounter (Signed)
 Pt called requesting Diflucan for abx-associated yeast infection.  Reviewed with patient, verified pharmacy, prescription sent per protocol.

## 2023-07-10 ENCOUNTER — Encounter (HOSPITAL_BASED_OUTPATIENT_CLINIC_OR_DEPARTMENT_OTHER): Payer: Self-pay

## 2023-07-10 ENCOUNTER — Ambulatory Visit (HOSPITAL_BASED_OUTPATIENT_CLINIC_OR_DEPARTMENT_OTHER)
Admission: RE | Admit: 2023-07-10 | Discharge: 2023-07-10 | Disposition: A | Discharge status: Home / Self Care | Payer: Self-pay | Source: Ambulatory Visit | Attending: Family Medicine | Admitting: Family Medicine

## 2023-07-10 VITALS — BP 154/83 | HR 110 | Temp 98.9°F | Resp 20

## 2023-07-10 DIAGNOSIS — R300 Dysuria: Secondary | ICD-10-CM | POA: Diagnosis present

## 2023-07-10 LAB — POCT URINALYSIS DIP (MANUAL ENTRY)
Bilirubin, UA: NEGATIVE
Glucose, UA: NEGATIVE mg/dL
Ketones, POC UA: NEGATIVE mg/dL
Nitrite, UA: POSITIVE — AB
Protein Ur, POC: 100 mg/dL — AB
Spec Grav, UA: >=1.03 — AB (ref 1.010–1.025)
Urobilinogen, UA: 0.2 U/dL
pH, UA: 5.5 (ref 5.0–8.0)

## 2023-07-10 MED ORDER — NITROFURANTOIN MONOHYD MACRO 100 MG PO CAPS: 100.0000 mg | ORAL_CAPSULE | Freq: Two times a day (BID) | ORAL | 0 refills | Status: DC

## 2023-07-10 NOTE — ED Triage Notes (Signed)
 Urinary frequency x 2-3 days. Seen on 06/18/23 for UTI. States antibiotics prescribed made her dizzy so didn't finish script. Low abd pain with pain to low back.

## 2023-07-10 NOTE — Discharge Instructions (Signed)
 Take the antibiotic as prescribed for urinary tract infection.  Make sure you are drinking plenty of fluids and staying hydrated please follow-up with your OB/GYN Also recommendations for [facility]. they are a hormonal management clinic
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
## 2023-07-12 LAB — URINE CULTURE: Culture: >=100000 — AB

## 2023-08-28 ENCOUNTER — Encounter (HOSPITAL_BASED_OUTPATIENT_CLINIC_OR_DEPARTMENT_OTHER): Payer: Self-pay

## 2023-08-28 ENCOUNTER — Ambulatory Visit (HOSPITAL_BASED_OUTPATIENT_CLINIC_OR_DEPARTMENT_OTHER)
Admission: RE | Admit: 2023-08-28 | Discharge: 2023-08-28 | Disposition: A | Discharge status: Home / Self Care | Source: Ambulatory Visit | Attending: Family Medicine | Admitting: Family Medicine

## 2023-08-28 VITALS — BP 134/88 | HR 79 | Temp 98.3°F | Resp 18

## 2023-08-28 DIAGNOSIS — R300 Dysuria: Secondary | ICD-10-CM | POA: Diagnosis not present

## 2023-08-28 DIAGNOSIS — N390 Urinary tract infection, site not specified: Secondary | ICD-10-CM | POA: Diagnosis present

## 2023-08-28 DIAGNOSIS — R110 Nausea: Secondary | ICD-10-CM | POA: Diagnosis present

## 2023-08-28 DIAGNOSIS — N760 Acute vaginitis: Secondary | ICD-10-CM | POA: Diagnosis not present

## 2023-08-28 LAB — POCT URINALYSIS DIP (MANUAL ENTRY)
Bilirubin, UA: NEGATIVE
Glucose, UA: NEGATIVE mg/dL
Ketones, POC UA: NEGATIVE mg/dL
Nitrite, UA: NEGATIVE
Protein Ur, POC: NEGATIVE mg/dL
Spec Grav, UA: 1.01
Urobilinogen, UA: 0.2 U/dL
pH, UA: 5.5

## 2023-08-28 MED ORDER — ONDANSETRON HCL 4 MG PO TABS: 4.0000 mg | ORAL_TABLET | Freq: Three times a day (TID) | ORAL | 0 refills | Status: AC | PRN

## 2023-08-28 MED ORDER — CEFDINIR 300 MG PO CAPS: 300.0000 mg | ORAL_CAPSULE | Freq: Two times a day (BID) | ORAL | 0 refills | Status: AC

## 2023-08-28 NOTE — Discharge Instructions (Signed)
 Urinalysis shows a trace of white blood cells and red blood cells.  Is not clearly showing infection.  Urine culture sent.  Based on her symptoms we will treat for acute UTI, mostly due to bladder spasms.  Use cefdinir, 300 mg twice daily for 5 days.  Get plenty of fluids with a goal of 3 L of fluids daily.  Patient is reporting some intermittent mild vaginal irritation or burning.  Pelvic exam done and no discharge noted.  I did swab the vaginal vault and will send it off for evaluation for bacterial vaginosis or vaginal candidiasis.  Will adjust the plan of care once those results come in.  Follow-up if symptoms do not improve, worsen or new symptoms occur.
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
## 2023-08-28 NOTE — ED Triage Notes (Signed)
 Pt c/o lower back pain x 1 week.  Nausea feeling and vaginal itching and discharge started this week.

## 2023-08-30 ENCOUNTER — Ambulatory Visit (HOSPITAL_BASED_OUTPATIENT_CLINIC_OR_DEPARTMENT_OTHER): Payer: Self-pay | Admitting: Family Medicine

## 2023-08-31 LAB — CERVICOVAGINAL ANCILLARY ONLY
Bacterial Vaginitis (gardnerella): NEGATIVE
Candida Glabrata: NEGATIVE
Candida Vaginitis: NEGATIVE
Comment: NEGATIVE
Comment: NEGATIVE
Comment: NEGATIVE

## 2023-08-31 LAB — URINE CULTURE

## 2023-08-31 MED ORDER — CEFIXIME 400 MG PO CAPS: 400.0000 mg | ORAL_CAPSULE | Freq: Every day | ORAL | 0 refills | Status: AC

## 2023-09-01 NOTE — Progress Notes (Signed)
 Patient updated via VM message.

## 2023-11-27 ENCOUNTER — Encounter (HOSPITAL_BASED_OUTPATIENT_CLINIC_OR_DEPARTMENT_OTHER): Payer: Self-pay

## 2023-11-27 ENCOUNTER — Ambulatory Visit (HOSPITAL_BASED_OUTPATIENT_CLINIC_OR_DEPARTMENT_OTHER): Admission: RE | Admit: 2023-11-27 | Discharge: 2023-11-27 | Disposition: A | Discharge status: Home / Self Care

## 2023-11-27 ENCOUNTER — Other Ambulatory Visit (HOSPITAL_BASED_OUTPATIENT_CLINIC_OR_DEPARTMENT_OTHER): Payer: Self-pay

## 2023-11-27 VITALS — BP 153/95 | HR 80 | Temp 98.3°F | Resp 20

## 2023-11-27 DIAGNOSIS — J0100 Acute maxillary sinusitis, unspecified: Secondary | ICD-10-CM

## 2023-11-27 DIAGNOSIS — R350 Frequency of micturition: Secondary | ICD-10-CM | POA: Diagnosis not present

## 2023-11-27 LAB — POCT URINE DIPSTICK
Bilirubin, UA: NEGATIVE
Blood, UA: NEGATIVE
Glucose, UA: NEGATIVE mg/dL
Ketones, POC UA: NEGATIVE mg/dL
Leukocytes, UA: NEGATIVE
Nitrite, UA: NEGATIVE
Protein Ur, POC: NEGATIVE mg/dL
Spec Grav, UA: >=1.03 — AB (ref 1.010–1.025)
Urobilinogen, UA: 0.2 U/dL
pH, UA: 6 (ref 5.0–8.0)

## 2023-11-27 MED ORDER — AMOXICILLIN-POT CLAVULANATE 875-125 MG PO TABS
1.0000 | ORAL_TABLET | Freq: Two times a day (BID) | ORAL | 0 refills | Status: DC
  Filled 2023-11-27: qty 14, 7d supply, fill #0

## 2023-11-27 MED ORDER — TRIAMCINOLONE ACETONIDE 40 MG/ML IJ SUSP
40.0000 mg | Freq: Once | INTRAMUSCULAR | Status: AC
  Administered 2023-11-27: 40 mg via INTRAMUSCULAR
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
## 2023-11-27 NOTE — Discharge Instructions (Signed)
 Your urine did not show any infection.  I will treat you for a sinus infection with amoxicillin.  Take the medication as prescribed.  Over-the-counter medications as needed.  Follow-up as needed

## 2023-11-27 NOTE — ED Triage Notes (Signed)
 Pt c/o facial pain and pressure since Tuesday. She is also having nasal congestion, sore throat- mostly at night. pt denies fever, chills, or body aches. Pt has taken otc cold and sinus meds and Excedrin migraine with no relief.   Pt also thinks she might have a uti. She is having urinary hesitancy, frequency, urgency, and vaginal pressure. State she had her bladder tack mesh removed years ago and has frequency since. Pt has not taken anything for her symptoms.

## 2024-01-08 ENCOUNTER — Ambulatory Visit (INDEPENDENT_AMBULATORY_CARE_PROVIDER_SITE_OTHER): Admit: 2024-01-08 | Discharge: 2024-01-08 | Disposition: A | Discharge status: Home / Self Care | Admitting: Radiology

## 2024-01-08 ENCOUNTER — Encounter (HOSPITAL_BASED_OUTPATIENT_CLINIC_OR_DEPARTMENT_OTHER): Payer: Self-pay

## 2024-01-08 ENCOUNTER — Ambulatory Visit (HOSPITAL_BASED_OUTPATIENT_CLINIC_OR_DEPARTMENT_OTHER)
Admission: RE | Admit: 2024-01-08 | Discharge: 2024-01-08 | Disposition: A | Discharge status: Home / Self Care | Attending: Family Medicine | Admitting: Family Medicine

## 2024-01-08 VITALS — BP 133/92 | HR 90 | Temp 98.0°F | Resp 20

## 2024-01-08 DIAGNOSIS — R109 Unspecified abdominal pain: Secondary | ICD-10-CM

## 2024-01-08 LAB — POCT URINE DIPSTICK
Bilirubin, UA: NEGATIVE
Blood, UA: NEGATIVE
Glucose, UA: NEGATIVE mg/dL
Ketones, POC UA: NEGATIVE mg/dL
Leukocytes, UA: NEGATIVE
Nitrite, UA: NEGATIVE
Protein Ur, POC: NEGATIVE mg/dL
Spec Grav, UA: 1.01 (ref 1.010–1.025)
Urobilinogen, UA: 0.2 U/dL
pH, UA: 5.5 (ref 5.0–8.0)

## 2024-01-08 MED ORDER — KETOROLAC TROMETHAMINE 30 MG/ML IJ SOLN
30.0000 mg | Freq: Once | INTRAMUSCULAR | Status: AC
  Administered 2024-01-08: 30 mg via INTRAMUSCULAR
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
## 2024-01-08 NOTE — Discharge Instructions (Addendum)
 Your x ray did  not show any obvious kidney stones. I am not sure you may be passing one and we just can't detect it. We gave you some pain medication here today. You can take your hydrocodone  as needed for pain.  Drink plenty of fluids.  Go to the ER if symptoms worsen.
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
## 2024-03-31 ENCOUNTER — Encounter (HOSPITAL_COMMUNITY): Payer: Self-pay | Admitting: *Deleted

## 2024-04-10 ENCOUNTER — Other Ambulatory Visit: Payer: Self-pay

## 2024-04-10 ENCOUNTER — Ambulatory Visit
Admission: RE | Admit: 2024-04-10 | Discharge: 2024-04-10 | Disposition: A | Discharge status: Home / Self Care | Payer: Self-pay | Attending: Physician Assistant | Admitting: Physician Assistant

## 2024-04-10 VITALS — BP 126/86 | HR 78 | Temp 97.7°F | Resp 17 | Ht 65.0 in | Wt 173.5 lb

## 2024-04-10 DIAGNOSIS — R8281 Pyuria: Secondary | ICD-10-CM | POA: Insufficient documentation

## 2024-04-10 DIAGNOSIS — N390 Urinary tract infection, site not specified: Secondary | ICD-10-CM | POA: Insufficient documentation

## 2024-04-10 DIAGNOSIS — N898 Other specified noninflammatory disorders of vagina: Secondary | ICD-10-CM | POA: Diagnosis not present

## 2024-04-10 DIAGNOSIS — R319 Hematuria, unspecified: Secondary | ICD-10-CM | POA: Insufficient documentation

## 2024-04-10 LAB — POCT URINE DIPSTICK
Glucose, UA: NEGATIVE mg/dL
Nitrite, UA: NEGATIVE
POC PROTEIN,UA: NEGATIVE
Spec Grav, UA: 1.015
Urobilinogen, UA: 0.2 U/dL
pH, UA: 6.5

## 2024-04-10 MED ORDER — FLUCONAZOLE 150 MG PO TABS: 150.0000 mg | ORAL_TABLET | ORAL | 0 refills | Status: DC | PRN

## 2024-04-10 MED ORDER — CEPHALEXIN 500 MG PO CAPS: 500.0000 mg | ORAL_CAPSULE | Freq: Two times a day (BID) | ORAL | 0 refills | Status: AC

## 2024-04-10 MED ORDER — NYSTATIN 100000 UNIT/GM EX CREA: TOPICAL_CREAM | CUTANEOUS | 0 refills | Status: DC
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
## 2024-04-10 NOTE — Discharge Instructions (Addendum)
 You are seen today for concerns of vulvovaginal discomfort and vaginal discharge changes.  Your symptoms appear consistent with a likely yeast infection so we have collected a cervicovaginal swab to assess for BV, yeast, trichomonas, gonorrhea and chlamydia.  Your urine was notable for signs of white blood cells as well as blood which can be consistent with a UTI.  Since you are having discomfort with urination  I am sending a sample of your urine off for urine culture for definitive rule out of a UTI or infection.  We will keep you updated with those results once they are available. I am starting you on an antibiotic called keflex  for you to take by mouth twice per day for 5 days. Please finish the entire course of the antibiotic unless you are instructed to stop or if you develop an allergic reaction.   We will keep you updated on the results of your cervicovaginal swab once the results are available.  If any other medication or treatment is indicated by those results that will be sent into the pharmacy that we have on file. Please make sure that you are practicing safe sex and using barrier methods to prevent exposure. It is recommended to avoid intercourse until you have the results back from testing and have completed any treatments that are sent in for you.   To assist with your symptoms while we are waiting on your test results I have sent in a medication called Diflucan  for you to take once every 72 hours.  This medication treats yeast infections fairly well and has relatively low side effect burden.  To assist with any external irritation I am sending in a cream called nystatin .  This medication helps to treat yeast infections of the skin.  Please do not use this inside the vagina as it can cause further irritation.

## 2024-04-10 NOTE — ED Triage Notes (Signed)
 Pt presents with a chief complaint of vaginal discharge x 6 days. Describes the discharge as white. Denies urinary burning but states the urine stream is hot when it comes out. No energy. Currently rates overall pain a 5/10. Feels like pressure. Labia feels very irritated. OTC Ibuprofen  + Tylenol  taken for symptoms/pain. In addition to hydrocodone . Did not seem to help.

## 2024-04-11 LAB — CERVICOVAGINAL ANCILLARY ONLY
Bacterial Vaginitis (gardnerella): NEGATIVE
Candida Glabrata: NEGATIVE
Candida Vaginitis: NEGATIVE
Chlamydia: NEGATIVE
Comment: NEGATIVE
Comment: NEGATIVE
Comment: NEGATIVE
Comment: NEGATIVE
Comment: NEGATIVE
Comment: NORMAL
Neisseria Gonorrhea: NEGATIVE
Trichomonas: NEGATIVE

## 2024-04-12 LAB — URINE CULTURE

## 2024-04-13 ENCOUNTER — Ambulatory Visit (HOSPITAL_COMMUNITY): Payer: Self-pay

## 2024-04-13 NOTE — Telephone Encounter (Signed)
 Pt returned call. Pt has had some improvement. Will continue meds and f/u as needed.

## 2024-04-22 ENCOUNTER — Ambulatory Visit (HOSPITAL_BASED_OUTPATIENT_CLINIC_OR_DEPARTMENT_OTHER)
Admission: RE | Admit: 2024-04-22 | Discharge: 2024-04-22 | Disposition: A | Discharge status: Home / Self Care | Attending: Family Medicine | Admitting: Family Medicine

## 2024-04-22 ENCOUNTER — Encounter (HOSPITAL_BASED_OUTPATIENT_CLINIC_OR_DEPARTMENT_OTHER): Payer: Self-pay

## 2024-04-22 VITALS — BP 109/68 | HR 75 | Temp 98.1°F | Resp 18

## 2024-04-22 DIAGNOSIS — N760 Acute vaginitis: Secondary | ICD-10-CM | POA: Diagnosis not present

## 2024-04-22 DIAGNOSIS — B3731 Acute candidiasis of vulva and vagina: Secondary | ICD-10-CM | POA: Diagnosis not present

## 2024-04-22 DIAGNOSIS — R300 Dysuria: Secondary | ICD-10-CM | POA: Insufficient documentation

## 2024-04-22 LAB — POCT URINE DIPSTICK
Bilirubin, UA: NEGATIVE
Blood, UA: NEGATIVE
Glucose, UA: NEGATIVE mg/dL
Ketones, POC UA: NEGATIVE mg/dL
Leukocytes, UA: NEGATIVE
Nitrite, UA: NEGATIVE
Protein Ur, POC: NEGATIVE mg/dL
Spec Grav, UA: 1.015
Urobilinogen, UA: 0.2 U/dL
pH, UA: 6.5

## 2024-04-22 MED ORDER — FLUCONAZOLE 150 MG PO TABS: ORAL_TABLET | ORAL | 1 refills | Status: AC

## 2024-04-22 MED ORDER — FLUCONAZOLE 150 MG PO TABS: ORAL_TABLET | ORAL | 0 refills | Status: DC

## 2024-04-22 MED ORDER — NYSTATIN 100000 UNIT/GM EX CREA: TOPICAL_CREAM | CUTANEOUS | 0 refills | Status: AC

## 2024-04-22 NOTE — ED Triage Notes (Signed)
 Pt reports she was seen on 12/28 at [facility] she is still having some vaginal discharge it has improved some but its not as bad as it was. Pt still has urinary pressure.

## 2024-04-22 NOTE — Discharge Instructions (Addendum)
 Vaginal candidiasis: Exam is consistent with a vaginal yeast infection.  STI swab done on 04/10/2024 was negative for yeast, BV, trichomoniasis, gonorrhea, chlamydia.  She had been on cephalexin  for possible UTI.  Fluconazole  150 x 3 pills did not seem to resolve it.  Take fluconazole  150 mg, 1 pill every 5 days up to 4 pills.  If symptoms do not completely resolve, 1 refill provided.  Use nystatin  cream, thin amounts vaginally up to 4 times daily if needed for vaginal itch and irritation.  Dysuria: UA is normal.  Patient worked to get a really good clean-catch.  Urine culture sent.  No antibiotics.  Will adjust the plan of care, if needed once the culture results.  Follow-up if symptoms do not improve, worsen or new symptoms occur.
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
## 2024-04-24 ENCOUNTER — Ambulatory Visit (HOSPITAL_BASED_OUTPATIENT_CLINIC_OR_DEPARTMENT_OTHER): Payer: Self-pay | Admitting: Family Medicine

## 2024-04-24 LAB — URINE CULTURE: Culture: NO GROWTH

## 2024-04-24 NOTE — Progress Notes (Signed)
 Urine culture is negative.  No UTI.  Antibiotics not needed.  Complete the treatment for yeast infection.  Patient updated via voicemail message.
